# Patient Record
Sex: Female | Born: 1940 | Race: White | Hispanic: No | Marital: Married | Smoking: Former smoker
Health system: Southern US, Community
[De-identification: ages and names within clinical notes are randomized; demographics above are authoritative.]

## PROBLEM LIST (undated history)

## (undated) DIAGNOSIS — I50810 Right heart failure, unspecified: Secondary | ICD-10-CM

## (undated) DIAGNOSIS — Z720 Tobacco use: Secondary | ICD-10-CM

## (undated) DIAGNOSIS — J449 Chronic obstructive pulmonary disease, unspecified: Secondary | ICD-10-CM

## (undated) DIAGNOSIS — M810 Age-related osteoporosis without current pathological fracture: Secondary | ICD-10-CM

## (undated) DIAGNOSIS — I2729 Other secondary pulmonary hypertension: Secondary | ICD-10-CM

## (undated) DIAGNOSIS — E785 Hyperlipidemia, unspecified: Secondary | ICD-10-CM

## (undated) DIAGNOSIS — I10 Essential (primary) hypertension: Secondary | ICD-10-CM

## (undated) DIAGNOSIS — I272 Pulmonary hypertension, unspecified: Secondary | ICD-10-CM

## (undated) HISTORY — DX: Essential (primary) hypertension: I10

## (undated) HISTORY — PX: JOINT REPLACEMENT: SHX530

## (undated) HISTORY — DX: Hyperlipidemia, unspecified: E78.5

---
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
## 2005-02-18 ENCOUNTER — Other Ambulatory Visit: Admission: RE | Admit: 2005-02-18 | Discharge: 2005-02-18 | Payer: Self-pay | Admitting: Obstetrics and Gynecology

## 2006-06-14 ENCOUNTER — Encounter: Admission: RE | Admit: 2006-06-14 | Discharge: 2006-06-14 | Payer: Self-pay | Admitting: Family Medicine

## 2011-02-25 ENCOUNTER — Ambulatory Visit: Payer: Self-pay | Admitting: Internal Medicine

## 2012-05-31 HISTORY — PX: COLONOSCOPY: SHX174

## 2012-12-26 ENCOUNTER — Encounter: Payer: Self-pay | Admitting: Internal Medicine

## 2013-02-15 ENCOUNTER — Ambulatory Visit (AMBULATORY_SURGERY_CENTER): Payer: Self-pay | Admitting: *Deleted

## 2013-02-15 VITALS — Ht 62.0 in | Wt 157.4 lb

## 2013-02-15 DIAGNOSIS — Z86010 Personal history of colonic polyps: Secondary | ICD-10-CM

## 2013-02-15 MED ORDER — MOVIPREP 100 G PO SOLR: ORAL | Status: DC

## 2013-02-15 NOTE — Progress Notes (Signed)
No allergies to eggs or soy. No problems with anesthesia.  

## 2013-02-16 ENCOUNTER — Encounter: Payer: Self-pay | Admitting: Internal Medicine

## 2013-03-02 ENCOUNTER — Ambulatory Visit (AMBULATORY_SURGERY_CENTER): Payer: Medicare Other | Admitting: Internal Medicine

## 2013-03-02 ENCOUNTER — Encounter: Payer: Self-pay | Admitting: Internal Medicine

## 2013-03-02 VITALS — BP 107/51 | HR 76 | Temp 96.5°F | Resp 22 | Ht 62.0 in | Wt 157.0 lb

## 2013-03-02 DIAGNOSIS — D126 Benign neoplasm of colon, unspecified: Secondary | ICD-10-CM

## 2013-03-02 DIAGNOSIS — Z86010 Personal history of colonic polyps: Secondary | ICD-10-CM

## 2013-03-02 DIAGNOSIS — Z800 Family history of malignant neoplasm of digestive organs: Secondary | ICD-10-CM

## 2013-03-02 MED ORDER — SODIUM CHLORIDE 0.9 % IV SOLN: 500.0000 mL | INTRAVENOUS | Status: DC

## 2013-03-02 NOTE — Progress Notes (Signed)
Called to room to assist during endoscopic procedure.  Patient ID and intended procedure confirmed with present staff. Received instructions for my participation in the procedure from the performing physician.  

## 2013-03-02 NOTE — Op Note (Signed)
[facility] [address] [place], [address]   COLONOSCOPY PROCEDURE REPORT  PATIENT: [name], [name]  MR#: [id number] BIRTHDATE: 03/08/1941 , 72  yrs. old GENDER: Female ENDOSCOPIST: Hart Carwin, MD REFERRED HQ:IONGE Hamrick, M.D. PROCEDURE DATE:  03/02/2013 PROCEDURE:   Colonoscopy with cold biopsy polypectomy and Colonoscopy with snare polypectomy First Screening Colonoscopy - Avg.  risk and is 50 yrs.  old or older - No.  Prior Negative Screening - Now for repeat screening. N/A  History of Adenoma - Now for follow-up colonoscopy & has been > or = to 3 yrs.  N/A  Polyps Removed Today? Yes. ASA CLASS:   Class II INDICATIONS:Patient's immediate family history of colon cancer and 2003 colonoscopy- hyperplastic polyp, father with colon cancer. MEDICATIONS: MAC sedation, administered by CRNA and propofol (Diprivan) 500mg  IV  DESCRIPTION OF PROCEDURE:   After the risks benefits and alternatives of the procedure were thoroughly explained, informed consent was obtained.  A digital rectal exam revealed decreased sphincter tone.   The LB PFC-H190 [id number]  endoscope was introduced through the anus and advanced to the cecum, which was identified by both the appendix and ileocecal valve. No adverse events experienced.   The quality of the prep was good, using MoviPrep The instrument was then slowly withdrawn as the colon was fully examined.      COLON FINDINGS: An innumerable number of polypoid shaped and smooth sessile polyps ranging between 5-83mm in size were found throughout the entire examined colon.  A polypectomy was performed with cold forceps, with a cold snare and using snare cautery.  The resection was complete and the polyp tissue was completely retrieved.   There was moderate diverticulosis noted in the sigmoid colon with associated tortuosity, muscular hypertrophy and angulation. Retroflexed views revealed no abnormalities. The time to  cecum=11 minutes 46 seconds.  Withdrawal time=34 minutes 16 seconds.  The scope was withdrawn and the procedure completed. COMPLICATIONS: There were no complications.  ENDOSCOPIC IMPRESSION: 1.   Innumerable number of sessile polyps ranging between 5-52mm in size were found throughout the entire examined colon, predominanetly in the sigmoid colon; polypectomy was performed with cold forceps, with a cold snare and using snare cautery , more than 20 polyps removed 2.   There was moderate diverticulosis noted in the sigmoid colon  RECOMMENDATIONS: 1.  Await pathology results 2.  High fiber diet 3.   repeat colonoscopy in 6 months no ASA or NSAID's x 2 weeks   eSigned:  Hart Carwin, MD 03/02/2013 12:33 PM   cc:   PATIENT NAME:  [name], [name] MR#: [id number]

## 2013-03-02 NOTE — Patient Instructions (Addendum)
Discharge instructions given with verbal understanding. Handouts on polyps and diverticulosis. Resume previous medications. Hold aspirin and aspirin products for 2 weeks. YOU HAD AN ENDOSCOPIC PROCEDURE TODAY AT THE [facility]: Refer to the procedure report that was given to you for any specific questions about what was found during the examination.  If the procedure report does not answer your questions, please call your gastroenterologist to clarify.  If you requested that your care partner not be given the details of your procedure findings, then the procedure report has been included in a sealed envelope for you to review at your convenience later.  YOU SHOULD EXPECT: Some feelings of bloating in the abdomen. Passage of more gas than usual.  Walking can help get rid of the air that was put into your GI tract during the procedure and reduce the bloating. If you had a lower endoscopy (such as a colonoscopy or flexible sigmoidoscopy) you may notice spotting of blood in your stool or on the toilet paper. If you underwent a bowel prep for your procedure, then you may not have a normal bowel movement for a few days.  DIET: Your first meal following the procedure should be a light meal and then it is ok to progress to your normal diet.  A half-sandwich or bowl of soup is an example of a good first meal.  Heavy or fried foods are harder to digest and may make you feel nauseous or bloated.  Likewise meals heavy in dairy and vegetables can cause extra gas to form and this can also increase the bloating.  Drink plenty of fluids but you should avoid alcoholic beverages for 24 hours.  ACTIVITY: Your care partner should take you home directly after the procedure.  You should plan to take it easy, moving slowly for the rest of the day.  You can resume normal activity the day after the procedure however you should NOT DRIVE or use heavy machinery for 24 hours (because of the sedation medicines used  during the test).    SYMPTOMS TO REPORT IMMEDIATELY: A gastroenterologist can be reached at any hour.  During normal business hours, 8:30 AM to 5:00 PM Monday through Friday, call [phone].  After hours and on weekends, please call the GI answering service at [phone] who will take a message and have the physician on call contact you.   Following lower endoscopy (colonoscopy or flexible sigmoidoscopy):  Excessive amounts of blood in the stool  Significant tenderness or worsening of abdominal pains  Swelling of the abdomen that is new, acute  Fever of 100F or higher  FOLLOW UP: If any biopsies were taken you will be contacted by phone or by letter within the next 1-3 weeks.  Call your gastroenterologist if you have not heard about the biopsies in 3 weeks.  Our staff will call the home number listed on your records the next business day following your procedure to check on you and address any questions or concerns that you may have at that time regarding the information given to you following your procedure. This is a courtesy call and so if there is no answer at the home number and we have not heard from you through the emergency physician on call, we will assume that you have returned to your regular daily activities without incident.  SIGNATURES/CONFIDENTIALITY: You and/or your care partner have signed paperwork which will be entered into your electronic medical record.  These signatures attest to the fact that that  the information above on your After Visit Summary has been reviewed and is understood.  Full responsibility of the confidentiality of this discharge information lies with you and/or your care-partner.

## 2013-03-02 NOTE — Progress Notes (Signed)
Patient did not experience any of the following events: a burn prior to discharge; a fall within the facility; wrong site/side/patient/procedure/implant event; or a hospital transfer or hospital admission upon discharge from the facility. (G8907) Patient did not have preoperative order for IV antibiotic SSI prophylaxis. (G8918)  

## 2013-03-02 NOTE — Progress Notes (Signed)
Procedure ends, to recovery, report given and VSS. 

## 2013-03-05 ENCOUNTER — Telehealth: Payer: Self-pay

## 2013-03-05 NOTE — Telephone Encounter (Signed)
  Follow up Call-  Call back number 03/02/2013  Post procedure Call Back phone  # [phone]  Permission to leave phone message Yes     Patient questions:  Do you have a fever, pain , or abdominal swelling? no Pain Score  0 *  Have you tolerated food without any problems? yes  Have you been able to return to your normal activities? yes  Do you have any questions about your discharge instructions: Diet   no Medications  no Follow up visit  no  Do you have questions or concerns about your Care? no  Actions: * If pain score is 4 or above: No action needed, pain <4.

## 2013-03-08 ENCOUNTER — Encounter: Payer: Self-pay | Admitting: Internal Medicine

## 2013-04-17 ENCOUNTER — Other Ambulatory Visit: Payer: Self-pay | Admitting: Ophthalmology

## 2013-04-17 DIAGNOSIS — H469 Unspecified optic neuritis: Secondary | ICD-10-CM

## 2013-05-01 ENCOUNTER — Other Ambulatory Visit: Payer: Medicare Other

## 2013-05-10 ENCOUNTER — Other Ambulatory Visit: Payer: Medicare Other

## 2015-01-04 ENCOUNTER — Emergency Department (HOSPITAL_COMMUNITY): Payer: Medicare HMO

## 2015-01-04 ENCOUNTER — Inpatient Hospital Stay (HOSPITAL_COMMUNITY): Payer: Medicare HMO

## 2015-01-04 ENCOUNTER — Inpatient Hospital Stay (HOSPITAL_COMMUNITY)
Admission: EM | Admit: 2015-01-04 | Discharge: 2015-01-07 | DRG: 481 | Disposition: A | Discharge status: Skilled Nursing Facility | Payer: Medicare HMO | Attending: Internal Medicine | Admitting: Internal Medicine

## 2015-01-04 ENCOUNTER — Encounter (HOSPITAL_COMMUNITY): Payer: Self-pay | Admitting: Emergency Medicine

## 2015-01-04 ENCOUNTER — Inpatient Hospital Stay (HOSPITAL_COMMUNITY): Payer: Medicare HMO | Admitting: Anesthesiology

## 2015-01-04 ENCOUNTER — Encounter (HOSPITAL_COMMUNITY)
Admission: EM | Disposition: A | Discharge status: Skilled Nursing Facility | Payer: Self-pay | Source: Home / Self Care | Attending: Internal Medicine

## 2015-01-04 DIAGNOSIS — F17210 Nicotine dependence, cigarettes, uncomplicated: Secondary | ICD-10-CM | POA: Diagnosis present

## 2015-01-04 DIAGNOSIS — M542 Cervicalgia: Secondary | ICD-10-CM

## 2015-01-04 DIAGNOSIS — Z7982 Long term (current) use of aspirin: Secondary | ICD-10-CM

## 2015-01-04 DIAGNOSIS — W1809XA Striking against other object with subsequent fall, initial encounter: Secondary | ICD-10-CM | POA: Diagnosis present

## 2015-01-04 DIAGNOSIS — D72829 Elevated white blood cell count, unspecified: Secondary | ICD-10-CM | POA: Diagnosis present

## 2015-01-04 DIAGNOSIS — Z79899 Other long term (current) drug therapy: Secondary | ICD-10-CM

## 2015-01-04 DIAGNOSIS — Y92009 Unspecified place in unspecified non-institutional (private) residence as the place of occurrence of the external cause: Secondary | ICD-10-CM

## 2015-01-04 DIAGNOSIS — M25552 Pain in left hip: Secondary | ICD-10-CM | POA: Diagnosis present

## 2015-01-04 DIAGNOSIS — E785 Hyperlipidemia, unspecified: Secondary | ICD-10-CM | POA: Diagnosis present

## 2015-01-04 DIAGNOSIS — R0902 Hypoxemia: Secondary | ICD-10-CM | POA: Diagnosis present

## 2015-01-04 DIAGNOSIS — S72002A Fracture of unspecified part of neck of left femur, initial encounter for closed fracture: Secondary | ICD-10-CM

## 2015-01-04 DIAGNOSIS — E871 Hypo-osmolality and hyponatremia: Secondary | ICD-10-CM | POA: Diagnosis present

## 2015-01-04 DIAGNOSIS — S72142A Displaced intertrochanteric fracture of left femur, initial encounter for closed fracture: Principal | ICD-10-CM | POA: Diagnosis present

## 2015-01-04 DIAGNOSIS — I10 Essential (primary) hypertension: Secondary | ICD-10-CM | POA: Diagnosis present

## 2015-01-04 DIAGNOSIS — Z419 Encounter for procedure for purposes other than remedying health state, unspecified: Secondary | ICD-10-CM

## 2015-01-04 DIAGNOSIS — S72009A Fracture of unspecified part of neck of unspecified femur, initial encounter for closed fracture: Secondary | ICD-10-CM | POA: Diagnosis present

## 2015-01-04 DIAGNOSIS — S72002D Fracture of unspecified part of neck of left femur, subsequent encounter for closed fracture with routine healing: Secondary | ICD-10-CM | POA: Diagnosis not present

## 2015-01-04 DIAGNOSIS — R0600 Dyspnea, unspecified: Secondary | ICD-10-CM

## 2015-01-04 HISTORY — PX: INTRAMEDULLARY (IM) NAIL INTERTROCHANTERIC: SHX5875

## 2015-01-04 LAB — COMPREHENSIVE METABOLIC PANEL
ALBUMIN: 4 g/dL (ref 3.5–5.0)
ALT: 20 U/L (ref 14–54)
ANION GAP: 9 (ref 5–15)
AST: 21 U/L (ref 15–41)
Alkaline Phosphatase: 71 U/L (ref 38–126)
BUN: 13 mg/dL (ref 6–20)
CO2: 26 mmol/L (ref 22–32)
Calcium: 9.1 mg/dL (ref 8.9–10.3)
Chloride: 95 mmol/L — ABNORMAL LOW (ref 101–111)
Creatinine, Ser: 0.71 mg/dL (ref 0.44–1.00)
GFR calc non Af Amer: >60 mL/min (ref >60)
Glucose, Bld: 118 mg/dL — ABNORMAL HIGH (ref 65–99)
Potassium: 4.2 mmol/L (ref 3.5–5.1)
Sodium: 130 mmol/L — ABNORMAL LOW (ref 135–145)
Total Bilirubin: 0.8 mg/dL (ref 0.3–1.2)
Total Protein: 7.2 g/dL (ref 6.5–8.1)

## 2015-01-04 LAB — CBC WITH DIFFERENTIAL/PLATELET
Basophils Absolute: 0 10*3/uL (ref 0.0–0.1)
Basophils Relative: 0 % (ref 0–1)
EOS ABS: 0 10*3/uL (ref 0.0–0.7)
Eosinophils Relative: 0 % (ref 0–5)
HEMATOCRIT: 36.5 % (ref 36.0–46.0)
Hemoglobin: 12.7 g/dL (ref 12.0–15.0)
LYMPHS PCT: 11 % — AB (ref 12–46)
Lymphs Abs: 1.6 10*3/uL (ref 0.7–4.0)
MCH: 32.4 pg (ref 26.0–34.0)
MCHC: 34.8 g/dL (ref 30.0–36.0)
MCV: 93.1 fL (ref 78.0–100.0)
Monocytes Absolute: 1.3 10*3/uL — ABNORMAL HIGH (ref 0.1–1.0)
Monocytes Relative: 9 % (ref 3–12)
NEUTROS ABS: 11.9 10*3/uL — AB (ref 1.7–7.7)
NEUTROS PCT: 80 % — AB (ref 43–77)
PLATELETS: 264 10*3/uL (ref 150–400)
RBC: 3.92 MIL/uL (ref 3.87–5.11)
RDW: 13.1 % (ref 11.5–15.5)
WBC: 14.9 10*3/uL — ABNORMAL HIGH (ref 4.0–10.5)

## 2015-01-04 LAB — URINALYSIS, ROUTINE W REFLEX MICROSCOPIC
Bilirubin Urine: NEGATIVE
GLUCOSE, UA: NEGATIVE mg/dL
Hgb urine dipstick: NEGATIVE
Ketones, ur: NEGATIVE mg/dL
Leukocytes, UA: NEGATIVE
Nitrite: NEGATIVE
Protein, ur: NEGATIVE mg/dL
Specific Gravity, Urine: 1.011 (ref 1.005–1.030)
Urobilinogen, UA: 0.2 mg/dL (ref 0.0–1.0)
pH: 6 (ref 5.0–8.0)

## 2015-01-04 LAB — I-STAT CHEM 8, ED
BUN: 13 mg/dL (ref 6–20)
CHLORIDE: 94 mmol/L — AB (ref 101–111)
Calcium, Ion: 1.12 mmol/L — ABNORMAL LOW (ref 1.13–1.30)
Creatinine, Ser: 0.7 mg/dL (ref 0.44–1.00)
Glucose, Bld: 122 mg/dL — ABNORMAL HIGH (ref 65–99)
HEMATOCRIT: 40 % (ref 36.0–46.0)
Hemoglobin: 13.6 g/dL (ref 12.0–15.0)
Potassium: 4.2 mmol/L (ref 3.5–5.1)
SODIUM: 129 mmol/L — AB (ref 135–145)
TCO2: 27 mmol/L (ref 0–100)

## 2015-01-04 SURGERY — FIXATION, FRACTURE, INTERTROCHANTERIC, WITH INTRAMEDULLARY ROD
Anesthesia: General | Site: Hip | Laterality: Left

## 2015-01-04 MED ORDER — FENTANYL CITRATE (PF) 100 MCG/2ML IJ SOLN
INTRAMUSCULAR | Status: AC
  Filled 2015-01-04: qty 2

## 2015-01-04 MED ORDER — 0.9 % SODIUM CHLORIDE (POUR BTL) OPTIME
TOPICAL | Status: DC | PRN
  Administered 2015-01-04: 1000 mL

## 2015-01-04 MED ORDER — MORPHINE SULFATE 2 MG/ML IJ SOLN
2.0000 mg | Freq: Once | INTRAMUSCULAR | Status: AC
  Administered 2015-01-04: 2 mg via INTRAVENOUS
  Filled 2015-01-04: qty 1

## 2015-01-04 MED ORDER — ENOXAPARIN SODIUM 40 MG/0.4ML ~~LOC~~ SOLN: 40.0000 mg | SUBCUTANEOUS | Status: DC

## 2015-01-04 MED ORDER — ONDANSETRON HCL 4 MG/2ML IJ SOLN
INTRAMUSCULAR | Status: AC
  Filled 2015-01-04: qty 2

## 2015-01-04 MED ORDER — HYDROCODONE-ACETAMINOPHEN 5-325 MG PO TABS: 1.0000 | ORAL_TABLET | Freq: Four times a day (QID) | ORAL | Status: DC | PRN

## 2015-01-04 MED ORDER — PROPOFOL 10 MG/ML IV BOLUS
INTRAVENOUS | Status: DC | PRN
  Administered 2015-01-04: 140 mg via INTRAVENOUS

## 2015-01-04 MED ORDER — ONDANSETRON HCL 4 MG/2ML IJ SOLN
INTRAMUSCULAR | Status: DC | PRN
  Administered 2015-01-04: 4 mg via INTRAVENOUS

## 2015-01-04 MED ORDER — EPHEDRINE SULFATE 50 MG/ML IJ SOLN
INTRAMUSCULAR | Status: AC
  Filled 2015-01-04: qty 1

## 2015-01-04 MED ORDER — NEOSTIGMINE METHYLSULFATE 10 MG/10ML IV SOLN
INTRAVENOUS | Status: DC | PRN
  Administered 2015-01-04: 3 mg via INTRAVENOUS

## 2015-01-04 MED ORDER — SUCCINYLCHOLINE CHLORIDE 20 MG/ML IJ SOLN
INTRAMUSCULAR | Status: DC | PRN
  Administered 2015-01-04: 100 mg via INTRAVENOUS

## 2015-01-04 MED ORDER — EPHEDRINE SULFATE 50 MG/ML IJ SOLN
INTRAMUSCULAR | Status: DC | PRN
  Administered 2015-01-04: 10 mg via INTRAVENOUS
  Administered 2015-01-04: 15 mg via INTRAVENOUS

## 2015-01-04 MED ORDER — CEFAZOLIN SODIUM-DEXTROSE 2-3 GM-% IV SOLR
2.0000 g | INTRAVENOUS | Status: AC
  Administered 2015-01-04: 2 g via INTRAVENOUS

## 2015-01-04 MED ORDER — GLYCOPYRROLATE 0.2 MG/ML IJ SOLN
INTRAMUSCULAR | Status: AC
  Filled 2015-01-04: qty 1

## 2015-01-04 MED ORDER — DEXAMETHASONE SODIUM PHOSPHATE 10 MG/ML IJ SOLN
INTRAMUSCULAR | Status: AC
  Filled 2015-01-04: qty 1

## 2015-01-04 MED ORDER — METHOCARBAMOL 500 MG PO TABS
500.0000 mg | ORAL_TABLET | Freq: Four times a day (QID) | ORAL | Status: DC | PRN
  Administered 2015-01-05 – 2015-01-07 (×3): 500 mg via ORAL
  Filled 2015-01-04 (×3): qty 1

## 2015-01-04 MED ORDER — DEXTROSE 5 % IV SOLN
500.0000 mg | Freq: Four times a day (QID) | INTRAVENOUS | Status: DC | PRN
  Administered 2015-01-05: 500 mg via INTRAVENOUS
  Filled 2015-01-04 (×2): qty 5

## 2015-01-04 MED ORDER — DEXAMETHASONE SODIUM PHOSPHATE 10 MG/ML IJ SOLN
INTRAMUSCULAR | Status: DC | PRN
  Administered 2015-01-04: 10 mg via INTRAVENOUS

## 2015-01-04 MED ORDER — NEOSTIGMINE METHYLSULFATE 10 MG/10ML IV SOLN
INTRAVENOUS | Status: AC
  Filled 2015-01-04: qty 1

## 2015-01-04 MED ORDER — PROPOFOL 10 MG/ML IV BOLUS
INTRAVENOUS | Status: AC
  Filled 2015-01-04: qty 20

## 2015-01-04 MED ORDER — CEFAZOLIN SODIUM-DEXTROSE 2-3 GM-% IV SOLR
INTRAVENOUS | Status: AC
  Filled 2015-01-04: qty 50

## 2015-01-04 MED ORDER — SODIUM CHLORIDE 0.9 % IV SOLN
INTRAVENOUS | Status: DC | PRN
  Administered 2015-01-04 (×2): via INTRAVENOUS

## 2015-01-04 MED ORDER — FENTANYL CITRATE (PF) 250 MCG/5ML IJ SOLN
INTRAMUSCULAR | Status: DC | PRN
  Administered 2015-01-04: 25 ug via INTRAVENOUS

## 2015-01-04 MED ORDER — FENTANYL CITRATE (PF) 100 MCG/2ML IJ SOLN
INTRAMUSCULAR | Status: AC
  Filled 2015-01-04: qty 4

## 2015-01-04 MED ORDER — ROCURONIUM BROMIDE 100 MG/10ML IV SOLN
INTRAVENOUS | Status: DC | PRN
  Administered 2015-01-04: 10 mg via INTRAVENOUS

## 2015-01-04 MED ORDER — FENTANYL CITRATE (PF) 100 MCG/2ML IJ SOLN
25.0000 ug | INTRAMUSCULAR | Status: DC | PRN
  Administered 2015-01-04: 25 ug via INTRAVENOUS

## 2015-01-04 MED ORDER — ONDANSETRON HCL 4 MG/2ML IJ SOLN
4.0000 mg | Freq: Once | INTRAMUSCULAR | Status: AC
  Administered 2015-01-04: 4 mg via INTRAVENOUS
  Filled 2015-01-04: qty 2

## 2015-01-04 MED ORDER — SODIUM CHLORIDE 0.9 % IV BOLUS (SEPSIS)
1000.0000 mL | Freq: Once | INTRAVENOUS | Status: AC
  Administered 2015-01-04: 1000 mL via INTRAVENOUS

## 2015-01-04 MED ORDER — GLYCOPYRROLATE 0.2 MG/ML IJ SOLN
INTRAMUSCULAR | Status: AC
  Filled 2015-01-04: qty 2

## 2015-01-04 MED ORDER — GLYCOPYRROLATE 0.2 MG/ML IJ SOLN
INTRAMUSCULAR | Status: DC | PRN
  Administered 2015-01-04: .5 mg via INTRAVENOUS

## 2015-01-04 SURGICAL SUPPLY — 42 items
BAG SPEC THK2 15X12 ZIP CLS (MISCELLANEOUS) ×1
BAG ZIPLOCK 12X15 (MISCELLANEOUS) ×2 IMPLANT
BIT DRILL CANN LG 4.3MM (BIT) IMPLANT
BNDG COHESIVE 6X5 TAN STRL LF (GAUZE/BANDAGES/DRESSINGS) ×1 IMPLANT
COVER PERINEAL POST (MISCELLANEOUS) ×1 IMPLANT
DRAPE C-ARM 42X120 X-RAY (DRAPES) ×1 IMPLANT
DRAPE INCISE IOBAN 66X45 STRL (DRAPES) ×2 IMPLANT
DRAPE ORTHO SPLIT 77X108 STRL (DRAPES)
DRAPE SHEET LG 3/4 BI-LAMINATE (DRAPES) ×1 IMPLANT
DRAPE STERI IOBAN 125X83 (DRAPES) ×2 IMPLANT
DRAPE SURG ORHT 6 SPLT 77X108 (DRAPES) ×1 IMPLANT
DRAPE U-SHAPE 47X51 STRL (DRAPES) ×1 IMPLANT
DRILL BIT CANN LG 4.3MM (BIT) ×2
DRSG MEPILEX BORDER 4X4 (GAUZE/BANDAGES/DRESSINGS) ×1 IMPLANT
DRSG PAD ABDOMINAL 8X10 ST (GAUZE/BANDAGES/DRESSINGS) IMPLANT
DURAPREP 26ML APPLICATOR (WOUND CARE) ×2 IMPLANT
ELECT REM PT RETURN 9FT ADLT (ELECTROSURGICAL) ×2
ELECTRODE REM PT RTRN 9FT ADLT (ELECTROSURGICAL) ×1 IMPLANT
FACESHIELD WRAPAROUND (MASK) ×2 IMPLANT
FACESHIELD WRAPAROUND OR TEAM (MASK) ×2 IMPLANT
GAUZE SPONGE 4X4 12PLY STRL (GAUZE/BANDAGES/DRESSINGS) ×2 IMPLANT
GLOVE ORTHO TXT STRL SZ7.5 (GLOVE) ×2 IMPLANT
GLOVE SURG ORTHO 8.5 STRL (GLOVE) ×2 IMPLANT
GOWN STRL REUS W/TWL LRG LVL3 (GOWN DISPOSABLE) ×4 IMPLANT
GUIDEPIN 3.2X17.5 THRD DISP (PIN) ×1 IMPLANT
KIT BASIN OR (CUSTOM PROCEDURE TRAY) ×2 IMPLANT
MANIFOLD NEPTUNE II (INSTRUMENTS) ×2 IMPLANT
NAIL HIP FRACT 130D 11X180 (Screw) ×1 IMPLANT
NS IRRIG 1000ML POUR BTL (IV SOLUTION) ×2 IMPLANT
PACK GENERAL/GYN (CUSTOM PROCEDURE TRAY) ×2 IMPLANT
PACK ORTHO EXTREMITY (CUSTOM PROCEDURE TRAY) ×1 IMPLANT
PAD CAST 4YDX4 CTTN HI CHSV (CAST SUPPLIES) ×1 IMPLANT
PADDING CAST COTTON 4X4 STRL (CAST SUPPLIES)
POSITIONER SURGICAL ARM (MISCELLANEOUS) ×3 IMPLANT
SCREW BONE CORTICAL 5.0X3 (Screw) ×1 IMPLANT
SCREW LAG HIP NAIL 10.5X95 (Screw) ×2 IMPLANT
STAPLER VISISTAT (STAPLE) ×2 IMPLANT
STOCKINETTE 8 INCH (MISCELLANEOUS) ×1 IMPLANT
SUT VIC AB 0 CT1 36 (SUTURE) ×4 IMPLANT
SUT VIC AB 2-0 CT1 27 (SUTURE) ×2
SUT VIC AB 2-0 CT1 TAPERPNT 27 (SUTURE) ×1 IMPLANT
TOWEL OR 17X26 10 PK STRL BLUE (TOWEL DISPOSABLE) ×4 IMPLANT

## 2015-01-04 NOTE — Anesthesia Procedure Notes (Signed)
Procedure Name: Intubation Date/Time: 01/04/2015 5:04 PM Performed by: Dione Booze Pre-anesthesia Checklist: Patient identified, Emergency Drugs available, Suction available and Patient being monitored Patient Re-evaluated:Patient Re-evaluated prior to inductionOxygen Delivery Method: Circle system utilized Preoxygenation: Pre-oxygenation with 100% oxygen Intubation Type: IV induction Laryngoscope Size: Mac and 4 Grade View: Grade II Tube type: Oral Tube size: 7.5 mm Number of attempts: 1 Airway Equipment and Method: Stylet Placement Confirmation: ETT inserted through vocal cords under direct vision,  breath sounds checked- equal and bilateral and positive ETCO2 Secured at: 21 cm Tube secured with: Tape Dental Injury: Teeth and Oropharynx as per pre-operative assessment  Comments: On [place] 3L on adm to OR. O2 sat 84 on RA

## 2015-01-04 NOTE — Brief Op Note (Signed)
01/04/2015  6:06 PM  PATIENT:  [name]  74 y.o. female  PRE-OPERATIVE DIAGNOSIS:  left intertroch fracture  POST-OPERATIVE DIAGNOSIS:  left intertroch fracture  PROCEDURE:  Procedure(s): INTRAMEDULLARY (IM) NAIL INTERTROCHANTRIC (Left) Biomet Affixis Nail 130 degree 33mm  SURGEON:  Surgeon(s) and Role:    * Netta Cedars, MD - Primary  PHYSICIAN ASSISTANT:   ASSISTANTS: Ventura Bruns, PA-C   ANESTHESIA:   general  EBL:  Total I/O In: 1000 [I.V.:1000] Out: 425 [Urine:425]  BLOOD ADMINISTERED:none  DRAINS: none   LOCAL MEDICATIONS USED:  NONE  SPECIMEN:  No Specimen  DISPOSITION OF SPECIMEN:  N/A  COUNTS:  YES  TOURNIQUET:  * No tourniquets in log *  DICTATION: .Other Dictation: Dictation Number [id number]  PLAN OF CARE: Admit to inpatient   PATIENT DISPOSITION:  PACU - hemodynamically stable.   Delay start of Pharmacological VTE agent (>24hrs) due to surgical blood loss or risk of bleeding: no

## 2015-01-04 NOTE — Discharge Instructions (Signed)
Ice to the left hip,  Partial (25%) body weight on the left LE  Patient will need 30 days of subQ Lovenox for DVT prophylaxis  Keep the incision covered and clean and dry for one week, then ok to shower and get the wound wet  Follow up with Dr Veverly Fells in two weeks  [phone]

## 2015-01-04 NOTE — ED Notes (Signed)
Patient transported to CT 

## 2015-01-04 NOTE — Consult Note (Signed)
Reason for Consult:Broken left hip Referring Physician: Elgergawy  [name] is an 74 y.o. female.  HPI: 74 yo female s/p mechanical fall today resulting in injury to the left hip. Patient complaining of severe left hip pain and inability to walk after the fall. Patient presented to the [facility] ED for further eval and treatment. Denies other pain or LOC.  Past Medical History  Diagnosis Date  . Hypertension   . Hyperlipidemia     Past Surgical History  Procedure Laterality Date  . Appendectomy  1954    Family History  Problem Relation Age of Onset  . Colon cancer Father 60    Social History:  reports that she has been smoking Cigarettes.  She has been smoking about 0.50 packs per day. She has never used smokeless tobacco. She reports that she drinks about 7.0 oz of alcohol per week. She reports that she does not use illicit drugs.  Allergies:  Allergies  Allergen Reactions  . Codeine Hives    Medications: I have reviewed the patient's current medications.  Results for orders placed or performed during the hospital encounter of 01/04/15 (from the past 48 hour(s))  CBC with Differential     Status: Abnormal   Collection Time: 01/04/15  2:30 PM  Result Value Ref Range   WBC 14.9 (H) 4.0 - 10.5 K/uL   RBC 3.92 3.87 - 5.11 MIL/uL   Hemoglobin 12.7 12.0 - 15.0 g/dL   HCT 36.5 36.0 - 46.0 %   MCV 93.1 78.0 - 100.0 fL   MCH 32.4 26.0 - 34.0 pg   MCHC 34.8 30.0 - 36.0 g/dL   RDW 13.1 11.5 - 15.5 %   Platelets 264 150 - 400 K/uL   Neutrophils Relative % 80 (H) 43 - 77 %   Neutro Abs 11.9 (H) 1.7 - 7.7 K/uL   Lymphocytes Relative 11 (L) 12 - 46 %   Lymphs Abs 1.6 0.7 - 4.0 K/uL   Monocytes Relative 9 3 - 12 %   Monocytes Absolute 1.3 (H) 0.1 - 1.0 K/uL   Eosinophils Relative 0 0 - 5 %   Eosinophils Absolute 0.0 0.0 - 0.7 K/uL   Basophils Relative 0 0 - 1 %   Basophils Absolute 0.0 0.0 - 0.1 K/uL  I-Stat Chem 8, ED     Status: Abnormal   Collection Time: 01/04/15  2:41 PM   Result Value Ref Range   Sodium 129 (L) 135 - 145 mmol/L   Potassium 4.2 3.5 - 5.1 mmol/L   Chloride 94 (L) 101 - 111 mmol/L   BUN 13 6 - 20 mg/dL   Creatinine, Ser 0.70 0.44 - 1.00 mg/dL   Glucose, Bld 122 (H) 65 - 99 mg/dL   Calcium, Ion 1.12 (L) 1.13 - 1.30 mmol/L   TCO2 27 0 - 100 mmol/L   Hemoglobin 13.6 12.0 - 15.0 g/dL   HCT 40.0 36.0 - 46.0 %    Dg Chest 1 View  01/04/2015   CLINICAL DATA:  74 year old female with history of trauma from a fall yesterday evening.  EXAM: CHEST  1 VIEW  COMPARISON:  No priors.  FINDINGS: Lung volumes are normal. No consolidative airspace disease. No pleural effusions. No pneumothorax. No evidence of pulmonary edema. Heart size is borderline enlarged. The patient is rotated to the right on today's exam, resulting in distortion of the mediastinal contours and reduced diagnostic sensitivity and specificity for mediastinal pathology. Visualized bony thorax is grossly intact. Old healed fractures of  the lateral aspects of the right seventh and eighth ribs.  IMPRESSION: 1. No radiographic evidence of acute cardiopulmonary disease.   Electronically Signed   By: Vinnie Langton M.D.   On: 01/04/2015 14:25   Dg Knee 2 Views Left  01/04/2015   CLINICAL DATA:  Pt from home. Around 1800 last night, pt was sitting at the table. Left foot felt tingly and "asleep." Went to stand up and tripped over chair and fell. C/o left femur pain and lower back pain. Foreshortening of left leg noted, as well as an external rotation. *Best obtainable images due to limited movement of pt.  EXAM: LEFT KNEE - 1-2 VIEW  COMPARISON:  None.  FINDINGS: No fracture. There is mild medial joint space compartment narrowing. No other arthropathic change. Bone to demineralized. No bone lesion.  No joint effusion.  Vascular calcifications are noted posteriorly.  IMPRESSION: No fracture or acute finding.   Electronically Signed   By: Lajean Manes M.D.   On: 01/04/2015 14:27   Ct Head Wo  Contrast  01/04/2015   CLINICAL DATA:  74 year old female with history of neck pain.  EXAM: CT HEAD WITHOUT CONTRAST  CT CERVICAL SPINE WITHOUT CONTRAST  TECHNIQUE: Multidetector CT imaging of the head and cervical spine was performed following the standard protocol without intravenous contrast. Multiplanar CT image reconstructions of the cervical spine were also generated.  COMPARISON:  No priors.  FINDINGS: CT HEAD FINDINGS  Mild cerebral atrophy. Patchy and confluent areas of decreased attenuation are noted throughout the deep and periventricular white matter of the cerebral hemispheres bilaterally, compatible with chronic microvascular ischemic disease. 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 No acute intracranial abnormalities. Specifically, no evidence of acute intracranial hemorrhage, no definite findings of acute/subacute cerebral ischemia, no mass, mass effect, hydrocephalus or abnormal intra or extra-axial fluid collections. Visualized paranasal sinuses and mastoids are well pneumatized. No acute displaced skull fractures are identified.  CT CERVICAL SPINE FINDINGS  No acute displaced fracture of the cervical spine. Straightening of normal cervical lordosis, favored to be positional. Alignment is otherwise anatomic. Multilevel degenerative disc disease, most severe at C5-C6 and C6-C7. Multilevel facet arthropathy. Prevertebral soft tissues are normal. Visualized portions of the upper thorax are unremarkable.  IMPRESSION: 1. No acute abnormality of the cervical spine to account for the patient's symptoms. 2. 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 3. No acute intracranial abnormalities. 4. Mild cerebral atrophy and chronic microvascular ischemic changes throughout the deep and periventricular white matter of the cerebral  hemispheres bilaterally. 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   Electronically Signed   By: Vinnie Langton M.D.   On: 01/04/2015 13:46   Ct Cervical Spine Wo Contrast  01/04/2015   CLINICAL DATA:  74 year old female with history of neck pain.  EXAM: CT HEAD WITHOUT CONTRAST  CT CERVICAL SPINE WITHOUT CONTRAST  TECHNIQUE: Multidetector CT imaging of the head and cervical spine was performed following the standard protocol without intravenous contrast. Multiplanar CT image reconstructions of the cervical spine were also generated.  COMPARISON:  No priors.  FINDINGS: CT HEAD FINDINGS  Mild cerebral atrophy. Patchy and confluent areas of decreased attenuation are noted throughout the deep and periventricular white matter of the cerebral hemispheres bilaterally, compatible with  chronic microvascular ischemic disease. 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 No acute intracranial abnormalities. Specifically, no evidence of acute intracranial hemorrhage, no definite findings of acute/subacute cerebral ischemia, no mass, mass effect, hydrocephalus or abnormal intra or extra-axial fluid collections. Visualized paranasal sinuses and mastoids are well pneumatized. No acute displaced skull fractures are identified.  CT CERVICAL SPINE FINDINGS  No acute displaced fracture of the cervical spine. Straightening of normal cervical lordosis, favored to be positional. Alignment is otherwise anatomic. Multilevel degenerative disc disease, most severe at C5-C6 and C6-C7. Multilevel facet arthropathy. Prevertebral soft tissues are normal. Visualized portions of the upper thorax are unremarkable.  IMPRESSION: 1. No acute abnormality of the cervical spine to account for the patient's symptoms. 2. There is  severe multilevel degenerative disc disease and cervical spondylosis, as above. 3. No acute intracranial abnormalities. 4. Mild cerebral atrophy and chronic microvascular ischemic changes throughout the deep and periventricular white matter of the cerebral hemispheres bilaterally. 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   Electronically Signed   By: Vinnie Langton M.D.   On: 01/04/2015 13:46   Dg Hip Unilat With Pelvis 2-3 Views Left  01/04/2015   CLINICAL DATA:  Pt from home. Around 1800 last night, pt was sitting at the table. Left foot felt tingly and "asleep." Went to stand up and tripped over chair and fell. C/o left femur pain and lower back pain. Foreshortening of left leg noted, as well as an external rotation. *Best obtainable images due to limited movement of pt.  EXAM: DG HIP (WITH OR WITHOUT PELVIS) 2-3V LEFT  COMPARISON:  None.  FINDINGS: There is an intertrochanteric fracture of the left proximal femur. Distal fracture component is displaced 19 mm lateral to the proximal fracture component. There is mild varus angulation. There is also apex anterior angulation. No significant comminution.  No other fractures. Bones are diffusely demineralized. Hip joints are normally aligned.  Soft tissues show dense iliac and femoral artery vascular calcifications.  IMPRESSION: Mildly displaced and angulated intertrochanteric fracture of the left proximal femur.   Electronically Signed   By: Lajean Manes M.D.   On: 01/04/2015 14:26   Dg Femur Min 2 Views Left  01/04/2015   CLINICAL DATA:  74 year old female with history of trauma from a fall yesterday complaining of left hip pain.  EXAM: LEFT FEMUR 2 VIEWS  COMPARISON:  No priors.  FINDINGS: Five views of the left femur demonstrate an acute mildly displace and angulated intertrochanteric fracture of the left hip, with approximately 20 degrees of varus angulation. Femoral  head appears properly located within the left acetabulum on the frontal projection.  IMPRESSION: 1. Acute minimally displaced mildly angulated intertrochanteric fracture of the left hip, as above.   Electronically Signed   By: Vinnie Langton M.D.   On: 01/04/2015 14:27    ROS Blood pressure 128/65, pulse 86, temperature 98.1 F (36.7 C), temperature source Oral, resp. rate 17, SpO2 90 %. Physical Exam AAO, moderate distress, neck nontender, normal ROM, bilateral shoulders elbow and wrists without pain and normal strength and ROM, right LE with pain free ROM, no deformity, left hip tender with limited ROM due to pain, left knee nontender, shortened extremity, externally rotated. NVI  Assessment/Plan: Displaced left intertrochanteric hip fracture,  Patient medically cleared  for surgery.. I discussed with the patient the need for surgery for this displaced hip fracture,.  She understands and agrees.  Plan IM nailing today. Likely short term SNF rehab.  Aiyana Stegmann,STEVEN R 01/04/2015, 3:16 PM

## 2015-01-04 NOTE — Op Note (Signed)
[name], [name] NO.:  [id number]  MEDICAL RECORD NO.:  [id number]  LOCATION:  64                         FACILITY:  [facility]  PHYSICIAN:  Doran Heater. Veverly Fells, M.D. DATE OF BIRTH:  [birth year]  DATE OF PROCEDURE:  01/04/2015 DATE OF DISCHARGE:                              OPERATIVE REPORT   PREOPERATIVE DIAGNOSIS:  Displaced left intertrochanteric hip fracture.  POSTOPERATIVE DIAGNOSIS:  Displaced left intertrochanteric hip fracture.  PROCEDURE PERFORMED:  Left hip intertrochanteric hip fracture, IM nailing using Biomet Affixus nail.  ATTENDING SURGEON:  Doran Heater. Veverly Fells, MD  ASSISTANT:  Charletta Cousin Dixon PA-C who has scrubbed the entire procedure and necessary for satisfactory completion of surgery.  ANESTHESIA:  General anesthesia was used.  ESTIMATED BLOOD LOSS:  Less than 100 mL.  FLUID REPLACEMENT:  1200 mL crystalloid.  INSTRUMENT COUNTS:  Correct.  COMPLICATIONS:  There were no complications.  ANTIBIOTICS:  Perioperative antibiotics were given.  INDICATIONS:  Patient is a 74 year old female, who presents with history of a mechanical fall injuring her left hip.  The patient presented with a displaced intertrochanteric hip fracture, requiring hip surgery, discussed this with the patient recommending surgical repair with IM nail.  Patient agreed.  Informed consent obtained.  DESCRIPTION OF PROCEDURE:  After an adequate level of anesthesia was achieved, the patient was positioned in the supine position.  She was placed on the fracture table with the perineal post.  Left foot was placed in traction boot and traction applied with internal rotation. The right leg was placed in modified lithotomy position.  C-arm was brought into the field.  Time-out was called.  We then assured that we had appropriate fracture reduction with the C-arm prior to our prep and drape.  Once we prepped and draped, we verified our time-out.  We then entered the femur  using a longitudinal incision proximal to the greater trochanter, dissection down through subcutaneous tissues using Bovie. We used the Bovie to divide the tensor fascia lata.  We then went ahead and found the greater trochanter and found our starting point with a guide pin with the fluoro.  We used multiplanar fluoroscopy to ensure the guide pin was entering the tip of the greater trochanter as verified on the AP and lateral views.  Once that the guide pin was in proper position for the Biomet trochanteric nail, we over reamed with the step- cut reamer.  We then introduced the 130 degree 11 mm Affixus nail antegrade across the fracture site, we got this to the appropriate depth.  We then placed a guide pin for the lag screw up into the femoral head verifying it's appropriate position on the AP and lateral views. We then over drilled with a step-cut drill for the lag screw and then measured to measurement of 95 mm length for the screw and then introduced a screw across the fracture site.  We gained good purchase in the inferior and posterior portions of the femoral neck and head and then went ahead and applied slight traction across that relieving some traction on the distal portion of the fracture table and then there was a fracture compression device on the nail  insertion handle which we were able to apply a little bit of pressure there and see the fracture site basically go away on the x-ray.  We then placed our statically locked bicortical distal interlock screw using the jig, this was 34 mm in length.  We inserted that in place.  We then obtained multiplanar views, AP and lateral, distally and proximally to make sure our screw was in good position.  Her fracture was anatomically reduced which it was.  We removed the insertion jig, thoroughly irrigated, and then repaired the deep tissues with 0 Vicryl suture followed by 2-0 Vicryl subcutaneous closure and staples for skin.  Sterile  compressive bandage applied. Patient transported to the recovery room in stable condition.     Doran Heater. Veverly Fells, M.D.     SRN/MEDQ  D:  01/04/2015  T:  01/04/2015  Job:  [id number]

## 2015-01-04 NOTE — ED Provider Notes (Signed)
CSN: [id number]     Arrival date & time 01/04/15  1250 History   First MD Initiated Contact with Patient 01/04/15 1252     Chief Complaint  Patient presents with  . Fall  . Leg Injury  . Hip Injury     (Consider location/radiation/quality/duration/timing/severity/associated sxs/prior Treatment) Patient is a 74 y.o. female presenting with fall. The history is provided by the patient.  Fall This is a new problem. The current episode started yesterday. The problem occurs rarely. The problem has been gradually worsening. Pertinent negatives include no chest pain, no abdominal pain, no headaches and no shortness of breath. The symptoms are aggravated by walking. Nothing relieves the symptoms. She has tried nothing for the symptoms. The treatment provided no relief.   74 yo F with a chief complaint of a fall. Patient states that her foot fell asleep and she stood up in the sensation of her foot asleep and dropped to the ground. Patient had some pain and swelling to her leg but ignored it until today. Patient with continuing worsening pain shortening and external rotation. Patient denies head injury denies loss of consciousness denies back pain chest pain abdominal pain. Past Medical History  Diagnosis Date  . Hypertension   . Hyperlipidemia    Past Surgical History  Procedure Laterality Date  . Appendectomy  1954   Family History  Problem Relation Age of Onset  . Colon cancer Father 61   History  Substance Use Topics  . Smoking status: Current Every Day Smoker -- 0.50 packs/day    Types: Cigarettes  . Smokeless tobacco: Never Used  . Alcohol Use: 7.0 oz/week    14 drink(s) per week   OB History    No data available     Review of Systems  Constitutional: Negative for fever and chills.  HENT: Negative for congestion and rhinorrhea.   Eyes: Negative for redness and visual disturbance.  Respiratory: Negative for shortness of breath and wheezing.   Cardiovascular: Negative for  chest pain and palpitations.  Gastrointestinal: Negative for nausea, vomiting and abdominal pain.  Genitourinary: Negative for dysuria and urgency.  Musculoskeletal: Positive for myalgias and arthralgias.  Skin: Negative for pallor and wound.  Neurological: Negative for dizziness and headaches.      Allergies  Codeine  Home Medications   Prior to Admission medications   Medication Sig Start Date End Date Taking? Authorizing Provider  acetaminophen (TYLENOL) 500 MG tablet Take 500 mg by mouth every 6 (six) hours as needed for mild pain or headache.   Yes Historical Provider, MD  amLODipine (NORVASC) 10 MG tablet Take 10 mg by mouth daily.   Yes Historical Provider, MD  aspirin 81 MG tablet Take 81 mg by mouth daily.   Yes Historical Provider, MD  benazepril (LOTENSIN) 20 MG tablet Take 20 mg by mouth daily.   Yes Historical Provider, MD  betamethasone valerate lotion (VALISONE) 0.1 % Apply 1 application topically 2 (two) times daily.   Yes Historical Provider, MD  metoprolol (LOPRESSOR) 50 MG tablet Take 50 mg by mouth daily.   Yes Historical Provider, MD   BP 128/65 mmHg  Pulse 86  Temp(Src) 98.1 F (36.7 C) (Oral)  Resp 17  SpO2 90% Physical Exam  Constitutional: She is oriented to person, place, and time. She appears well-developed and well-nourished. No distress.  HENT:  Head: Normocephalic and atraumatic.  Eyes: EOM are normal. Pupils are equal, round, and reactive to light.  Neck: Normal range of motion. Neck supple.  Cardiovascular: Normal rate and regular rhythm.  Exam reveals no gallop and no friction rub.   No murmur heard. Pulmonary/Chest: Effort normal. She has no wheezes. She has no rales.  Abdominal: Soft. She exhibits no distension. There is no tenderness.  Musculoskeletal: She exhibits edema. She exhibits no tenderness.  Marked edema to proximal left thigh. Pulse motor and sensation intact distally. Painful to the mid shaft of the femur as well as  left hip.   Neurological: She is alert and oriented to person, place, and time.  Skin: Skin is warm and dry. She is not diaphoretic.  Psychiatric: She has a normal mood and affect. Her behavior is normal.    ED Course  Procedures (including critical care time) Labs Review Labs Reviewed  CBC WITH DIFFERENTIAL/PLATELET - Abnormal; Notable for the following:    WBC 14.9 (*)    Neutrophils Relative % 80 (*)    Neutro Abs 11.9 (*)    Lymphocytes Relative 11 (*)    Monocytes Absolute 1.3 (*)    All other components within normal limits  COMPREHENSIVE METABOLIC PANEL - Abnormal; Notable for the following:    Sodium 130 (*)    Chloride 95 (*)    Glucose, Bld 118 (*)    All other components within normal limits  I-STAT CHEM 8, ED - Abnormal; Notable for the following:    Sodium 129 (*)    Chloride 94 (*)    Glucose, Bld 122 (*)    Calcium, Ion 1.12 (*)    All other components within normal limits  URINALYSIS, ROUTINE W REFLEX MICROSCOPIC (NOT AT [facility])    Imaging Review Dg Chest 1 View  01/04/2015   CLINICAL DATA:  74 year old female with history of trauma from a fall yesterday evening.  EXAM: CHEST  1 VIEW  COMPARISON:  No priors.  FINDINGS: Lung volumes are normal. No consolidative airspace disease. No pleural effusions. No pneumothorax. No evidence of pulmonary edema. Heart size is borderline enlarged. The patient is rotated to the right on today's exam, resulting in distortion of the mediastinal contours and reduced diagnostic sensitivity and specificity for mediastinal pathology. Visualized bony thorax is grossly intact. Old healed fractures of the lateral aspects of the right seventh and eighth ribs.  IMPRESSION: 1. No radiographic evidence of acute cardiopulmonary disease.   Electronically Signed   By: Vinnie Langton M.D.   On: 01/04/2015 14:25   Dg Knee 2 Views Left  01/04/2015   CLINICAL DATA:  Pt from home. Around 1800 last night, pt was sitting at the table. Left foot felt tingly and  "asleep." Went to stand up and tripped over chair and fell. C/o left femur pain and lower back pain. Foreshortening of left leg noted, as well as an external rotation. *Best obtainable images due to limited movement of pt.  EXAM: LEFT KNEE - 1-2 VIEW  COMPARISON:  None.  FINDINGS: No fracture. There is mild medial joint space compartment narrowing. No other arthropathic change. Bone to demineralized. No bone lesion.  No joint effusion.  Vascular calcifications are noted posteriorly.  IMPRESSION: No fracture or acute finding.   Electronically Signed   By: Lajean Manes M.D.   On: 01/04/2015 14:27   Ct Head Wo Contrast  01/04/2015   CLINICAL DATA:  74 year old female with history of neck pain.  EXAM: CT HEAD WITHOUT CONTRAST  CT CERVICAL SPINE WITHOUT CONTRAST  TECHNIQUE: Multidetector CT imaging of the head and cervical spine was performed following the standard protocol without  intravenous contrast. Multiplanar CT image reconstructions of the cervical spine were also generated.  COMPARISON:  No priors.  FINDINGS: CT HEAD FINDINGS  Mild cerebral atrophy. Patchy and confluent areas of decreased attenuation are noted throughout the deep and periventricular white matter of the cerebral hemispheres bilaterally, compatible with chronic microvascular ischemic disease. 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 No acute intracranial abnormalities. Specifically, no evidence of acute intracranial hemorrhage, no definite findings of acute/subacute cerebral ischemia, no mass, mass effect, hydrocephalus or abnormal intra or extra-axial fluid collections. Visualized paranasal sinuses and mastoids are well pneumatized. No acute displaced skull fractures are identified.  CT CERVICAL SPINE FINDINGS  No acute displaced fracture of the cervical spine. Straightening of normal cervical lordosis,  favored to be positional. Alignment is otherwise anatomic. Multilevel degenerative disc disease, most severe at C5-C6 and C6-C7. Multilevel facet arthropathy. Prevertebral soft tissues are normal. Visualized portions of the upper thorax are unremarkable.  IMPRESSION: 1. No acute abnormality of the cervical spine to account for the patient's symptoms. 2. 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 3. No acute intracranial abnormalities. 4. Mild cerebral atrophy and chronic microvascular ischemic changes throughout the deep and periventricular white matter of the cerebral hemispheres bilaterally. 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   Electronically Signed   By: Vinnie Langton M.D.   On: 01/04/2015 13:46   Ct Cervical Spine Wo Contrast  01/04/2015   CLINICAL DATA:  74 year old female with history of neck pain.  EXAM: CT HEAD WITHOUT CONTRAST  CT CERVICAL SPINE WITHOUT CONTRAST  TECHNIQUE: Multidetector CT imaging of the head and cervical spine was performed following the standard protocol without intravenous contrast. Multiplanar CT image reconstructions of the cervical spine were also generated.  COMPARISON:  No priors.  FINDINGS: CT HEAD FINDINGS  Mild cerebral atrophy. Patchy and confluent areas of decreased attenuation are noted throughout the deep and periventricular white matter of the cerebral hemispheres bilaterally, compatible with chronic microvascular ischemic disease. 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 No acute intracranial abnormalities. Specifically, no evidence of acute intracranial hemorrhage, no definite findings of acute/subacute cerebral ischemia, no mass, mass effect, hydrocephalus or abnormal intra or extra-axial fluid  collections. Visualized paranasal sinuses and mastoids are well pneumatized. No acute displaced skull fractures are identified.  CT CERVICAL SPINE FINDINGS  No acute displaced fracture of the cervical spine. Straightening of normal cervical lordosis, favored to be positional. Alignment is otherwise anatomic. Multilevel degenerative disc disease, most severe at C5-C6 and C6-C7. Multilevel facet arthropathy. Prevertebral soft tissues are normal. Visualized portions of the upper thorax are unremarkable.  IMPRESSION: 1. No acute abnormality of the cervical spine to account for the patient's symptoms. 2. 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 3. No acute intracranial abnormalities. 4. Mild cerebral atrophy and chronic microvascular ischemic changes throughout the deep and periventricular white matter of the cerebral hemispheres bilaterally. 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   Electronically Signed   By: Vinnie Langton M.D.   On: 01/04/2015  13:46   Dg Hip Unilat With Pelvis 2-3 Views Left  01/04/2015   CLINICAL DATA:  Pt from home. Around 1800 last night, pt was sitting at the table. Left foot felt tingly and "asleep." Went to stand up and tripped over chair and fell. C/o left femur pain and lower back pain. Foreshortening of left leg noted, as well as an external rotation. *Best obtainable images due to limited movement of pt.  EXAM: DG HIP (WITH OR WITHOUT PELVIS) 2-3V LEFT  COMPARISON:  None.  FINDINGS: There is an intertrochanteric fracture of the left proximal femur. Distal fracture component is displaced 19 mm lateral to the proximal fracture component. There is mild varus angulation. There is also apex anterior angulation. No significant comminution.  No other fractures. Bones are diffusely demineralized. Hip joints are normally aligned.  Soft tissues show dense iliac  and femoral artery vascular calcifications.  IMPRESSION: Mildly displaced and angulated intertrochanteric fracture of the left proximal femur.   Electronically Signed   By: Lajean Manes M.D.   On: 01/04/2015 14:26   Dg Femur Min 2 Views Left  01/04/2015   CLINICAL DATA:  74 year old female with history of trauma from a fall yesterday complaining of left hip pain.  EXAM: LEFT FEMUR 2 VIEWS  COMPARISON:  No priors.  FINDINGS: Five views of the left femur demonstrate an acute mildly displace and angulated intertrochanteric fracture of the left hip, with approximately 20 degrees of varus angulation. Femoral head appears properly located within the left acetabulum on the frontal projection.  IMPRESSION: 1. Acute minimally displaced mildly angulated intertrochanteric fracture of the left hip, as above.   Electronically Signed   By: Vinnie Langton M.D.   On: 01/04/2015 14:27     EKG Interpretation None      MDM   Final diagnoses:  Neck pain  Hip fracture, left, closed, initial encounter  Hyponatremia    74 yo F with a chief complaint of left hip pain. Patient found to have a acute intertrochanteric fracture. Spoke with orthopedics will take to the OR this afternoon admit to hospitalist.    Deno Etienne, DO 01/04/15 [id number]

## 2015-01-04 NOTE — Anesthesia Preprocedure Evaluation (Addendum)
Anesthesia Evaluation  Patient identified by MRN, date of birth, ID band Patient awake    Reviewed: Allergy & Precautions, H&P , NPO status , Patient's Chart, lab work & pertinent test results, reviewed documented beta blocker date and time   Airway Mallampati: II  TM Distance: >3 FB Neck ROM: Full    Dental no notable dental hx. (+) Teeth Intact, Dental Advisory Given   Pulmonary Current Smoker,  breath sounds clear to auscultation  Pulmonary exam normal       Cardiovascular hypertension, Pt. on medications and Pt. on home beta blockers Rhythm:Regular Rate:Normal     Neuro/Psych negative neurological ROS  negative psych ROS   GI/Hepatic negative GI ROS, Neg liver ROS,   Endo/Other  negative endocrine ROS  Renal/GU negative Renal ROS  negative genitourinary   Musculoskeletal   Abdominal   Peds  Hematology negative hematology ROS (+)   Anesthesia Other Findings   Reproductive/Obstetrics negative OB ROS                            Anesthesia Physical Anesthesia Plan  ASA: II  Anesthesia Plan: General   Post-op Pain Management:    Induction: Intravenous  Airway Management Planned: Oral ETT  Additional Equipment:   Intra-op Plan:   Post-operative Plan: Extubation in OR  Informed Consent: I have reviewed the patients History and Physical, chart, labs and discussed the procedure including the risks, benefits and alternatives for the proposed anesthesia with the patient or authorized representative who has indicated his/her understanding and acceptance.   Dental advisory given  Plan Discussed with: CRNA  Anesthesia Plan Comments:         Anesthesia Quick Evaluation

## 2015-01-04 NOTE — ED Notes (Signed)
Bed: WA21 Expected date:  Expected time:  Means of arrival:  Comments: EMS- Fall, Hip pain

## 2015-01-04 NOTE — H&P (Signed)
Patient Demographics  [name], is a 74 y.o. female  MRN: [id number]   DOB - [birth year]  Admit Date - 01/04/2015  Outpatient Primary MD for the patient is Leonides Sake, MD   With History of -  Past Medical History  Diagnosis Date  . Hypertension   . Hyperlipidemia       Past Surgical History  Procedure Laterality Date  . Appendectomy  1954    in for   Chief Complaint  Patient presents with  . Fall  . Leg Injury  . Hip Injury     HPI  [name]  is a 74 y.o. female, with past medical history of hypertension, presents with mechanical fall and right hip pain, patient reports she tripped , where she had a fall , with significant left hip pain, hip x-ray significant for a fracture, CT head and cervical spine with no acute findings, she denies any dizziness or lightheadedness opreceding the fall, denies any loss of consciousness ,denies any chest pain, shortness of breath, palpitation, fever, workup significant for mild leukocytosis and hyponatremia, denies any cardiac history, hospitalist called to admit.    Review of Systems    In addition to the HPI above,  No Fever-chills, No Headache, No changes with Vision or hearing, No problems swallowing food or Liquids, No Chest pain, Cough or Shortness of Breath, No Abdominal pain, No Nausea or Vommitting, Bowel movements are regular, No Blood in stool or Urine, No dysuria, No new skin rashes or bruises, Complains of left hip pain No new weakness, tingling, numbness in any extremity, No recent weight gain or loss, No polyuria, polydypsia or polyphagia, No significant Mental Stressors.  A full 10 point Review of Systems was done, except as stated above, all other Review of Systems were negative.   Social History History  Substance Use Topics  . Smoking status: Current Every Day Smoker -- 0.50 packs/day    Types: Cigarettes  . Smokeless tobacco: Never Used  . Alcohol Use: 7.0 oz/week    14 drink(s) per week      Family History Family History  Problem Relation Age of Onset  . Colon cancer Father 36     Prior to Admission medications   Medication Sig Start Date End Date Taking? Authorizing Provider  acetaminophen (TYLENOL) 500 MG tablet Take 500 mg by mouth every 6 (six) hours as needed for mild pain or headache.   Yes Historical Provider, MD  amLODipine (NORVASC) 10 MG tablet Take 10 mg by mouth daily.   Yes Historical Provider, MD  aspirin 81 MG tablet Take 81 mg by mouth daily.   Yes Historical Provider, MD  benazepril (LOTENSIN) 20 MG tablet Take 20 mg by mouth daily.   Yes Historical Provider, MD  betamethasone valerate lotion (VALISONE) 0.1 % Apply 1 application topically 2 (two) times daily.   Yes Historical Provider, MD  metoprolol (LOPRESSOR) 50 MG tablet Take 50 mg by mouth daily.   Yes Historical Provider, MD    Allergies  Allergen Reactions  . Codeine Hives    Physical Exam  Vitals  Blood pressure 128/65, pulse 86, temperature 98.1 F (36.7 C), temperature source Oral, resp. rate 17, SpO2 90 %.   1. General well-nourished female lying in bed in NAD,    2. Normal affect and insight, Not Suicidal or Homicidal, Awake Alert, Oriented X 3.  3. No F.N deficits, ALL C.Nerves Intact, Strength 5/5 all 4 extremities, Sensation intact all 4 extremities, Plantars down  going.  4. Ears and Eyes appear Normal, Conjunctivae clear, PERRLA. Moist Oral Mucosa.  5. Supple Neck, No JVD, No cervical lymphadenopathy appriciated, No Carotid Bruits.  6. Symmetrical Chest wall movement, Good air movement bilaterally, CTAB.  7. RRR, No Gallops, Rubs or Murmurs, No Parasternal Heave.  8. Positive Bowel Sounds, Abdomen Soft, No tenderness, No organomegaly appriciated,No rebound -guarding or rigidity.  9.  No Cyanosis, Normal Skin Turgor, No Skin Rash or Bruise.  10. Good muscle tone,  joints appear normal , no effusions, left hip tenderness and pain on movement  11. No Palpable Lymph  Nodes in Neck or Axillae    Data Review  CBC  Recent Labs Lab 01/04/15 1430 01/04/15 1441  WBC 14.9*  --   HGB 12.7 13.6  HCT 36.5 40.0  PLT 264  --   MCV 93.1  --   MCH 32.4  --   MCHC 34.8  --   RDW 13.1  --   LYMPHSABS 1.6  --   MONOABS 1.3*  --   EOSABS 0.0  --   BASOSABS 0.0  --    ------------------------------------------------------------------------------------------------------------------  Chemistries   Recent Labs Lab 01/04/15 1430 01/04/15 1441  NA 130* 129*  K 4.2 4.2  CL 95* 94*  CO2 26  --   GLUCOSE 118* 122*  BUN 13 13  CREATININE 0.71 0.70  CALCIUM 9.1  --   AST 21  --   ALT 20  --   ALKPHOS 71  --   BILITOT 0.8  --    ------------------------------------------------------------------------------------------------------------------ CrCl cannot be calculated (Unknown ideal weight.). ------------------------------------------------------------------------------------------------------------------ No results for input(s): TSH, T4TOTAL, T3FREE, THYROIDAB in the last 72 hours.  Invalid input(s): FREET3   Coagulation profile No results for input(s): INR, PROTIME in the last 168 hours. ------------------------------------------------------------------------------------------------------------------- No results for input(s): DDIMER in the last 72 hours. -------------------------------------------------------------------------------------------------------------------  Cardiac Enzymes No results for input(s): CKMB, TROPONINI, MYOGLOBIN in the last 168 hours.  Invalid input(s): CK ------------------------------------------------------------------------------------------------------------------ Invalid input(s): POCBNP   ---------------------------------------------------------------------------------------------------------------  Urinalysis No results found for: COLORURINE, APPEARANCEUR, LABSPEC, [place], GLUCOSEU, HGBUR, BILIRUBINUR,  KETONESUR, PROTEINUR, UROBILINOGEN, NITRITE, LEUKOCYTESUR  ----------------------------------------------------------------------------------------------------------------  Imaging results:   Dg Chest 1 View  01/04/2015   CLINICAL DATA:  74 year old female with history of trauma from a fall yesterday evening.  EXAM: CHEST  1 VIEW  COMPARISON:  No priors.  FINDINGS: Lung volumes are normal. No consolidative airspace disease. No pleural effusions. No pneumothorax. No evidence of pulmonary edema. Heart size is borderline enlarged. The patient is rotated to the right on today's exam, resulting in distortion of the mediastinal contours and reduced diagnostic sensitivity and specificity for mediastinal pathology. Visualized bony thorax is grossly intact. Old healed fractures of the lateral aspects of the right seventh and eighth ribs.  IMPRESSION: 1. No radiographic evidence of acute cardiopulmonary disease.   Electronically Signed   By: Vinnie Langton M.D.   On: 01/04/2015 14:25   Dg Knee 2 Views Left  01/04/2015   CLINICAL DATA:  Pt from home. Around 1800 last night, pt was sitting at the table. Left foot felt tingly and "asleep." Went to stand up and tripped over chair and fell. C/o left femur pain and lower back pain. Foreshortening of left leg noted, as well as an external rotation. *Best obtainable images due to limited movement of pt.  EXAM: LEFT KNEE - 1-2 VIEW  COMPARISON:  None.  FINDINGS: No fracture. There is mild medial joint space compartment narrowing. No other arthropathic change.  Bone to demineralized. No bone lesion.  No joint effusion.  Vascular calcifications are noted posteriorly.  IMPRESSION: No fracture or acute finding.   Electronically Signed   By: Lajean Manes M.D.   On: 01/04/2015 14:27   Ct Head Wo Contrast  01/04/2015   CLINICAL DATA:  74 year old female with history of neck pain.  EXAM: CT HEAD WITHOUT CONTRAST  CT CERVICAL SPINE WITHOUT CONTRAST  TECHNIQUE: Multidetector CT  imaging of the head and cervical spine was performed following the standard protocol without intravenous contrast. Multiplanar CT image reconstructions of the cervical spine were also generated.  COMPARISON:  No priors.  FINDINGS: CT HEAD FINDINGS  Mild cerebral atrophy. Patchy and confluent areas of decreased attenuation are noted throughout the deep and periventricular white matter of the cerebral hemispheres bilaterally, compatible with chronic microvascular ischemic disease. 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 No acute intracranial abnormalities. Specifically, no evidence of acute intracranial hemorrhage, no definite findings of acute/subacute cerebral ischemia, no mass, mass effect, hydrocephalus or abnormal intra or extra-axial fluid collections. Visualized paranasal sinuses and mastoids are well pneumatized. No acute displaced skull fractures are identified.  CT CERVICAL SPINE FINDINGS  No acute displaced fracture of the cervical spine. Straightening of normal cervical lordosis, favored to be positional. Alignment is otherwise anatomic. Multilevel degenerative disc disease, most severe at C5-C6 and C6-C7. Multilevel facet arthropathy. Prevertebral soft tissues are normal. Visualized portions of the upper thorax are unremarkable.  IMPRESSION: 1. No acute abnormality of the cervical spine to account for the patient's symptoms. 2. 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 3. No acute intracranial abnormalities. 4. Mild cerebral atrophy and chronic microvascular ischemic changes throughout the deep and periventricular white matter of the cerebral hemispheres bilaterally. 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   Electronically Signed   By:  Vinnie Langton M.D.   On: 01/04/2015 13:46   Ct Cervical Spine Wo Contrast  01/04/2015   CLINICAL DATA:  74 year old female with history of neck pain.  EXAM: CT HEAD WITHOUT CONTRAST  CT CERVICAL SPINE WITHOUT CONTRAST  TECHNIQUE: Multidetector CT imaging of the head and cervical spine was performed following the standard protocol without intravenous contrast. Multiplanar CT image reconstructions of the cervical spine were also generated.  COMPARISON:  No priors.  FINDINGS: CT HEAD FINDINGS  Mild cerebral atrophy. Patchy and confluent areas of decreased attenuation are noted throughout the deep and periventricular white matter of the cerebral hemispheres bilaterally, compatible with chronic microvascular ischemic disease. 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 No acute intracranial abnormalities. Specifically, no evidence of acute intracranial hemorrhage, no definite findings of acute/subacute cerebral ischemia, no mass, mass effect, hydrocephalus or abnormal intra or extra-axial fluid collections. Visualized paranasal sinuses and mastoids are well pneumatized. No acute displaced skull fractures are identified.  CT CERVICAL SPINE FINDINGS  No acute displaced fracture of the cervical spine. Straightening of normal cervical lordosis, favored to be positional. Alignment is otherwise anatomic. Multilevel degenerative disc disease, most severe at C5-C6 and C6-C7. Multilevel facet arthropathy. Prevertebral soft tissues are normal. Visualized portions of the upper thorax are unremarkable.  IMPRESSION: 1. No  acute abnormality of the cervical spine to account for the patient's symptoms. 2. 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 3. No acute intracranial abnormalities. 4. Mild cerebral atrophy and chronic microvascular ischemic changes  throughout the deep and periventricular white matter of the cerebral hemispheres bilaterally. 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   Electronically Signed   By: Vinnie Langton M.D.   On: 01/04/2015 13:46   Dg Hip Unilat With Pelvis 2-3 Views Left  01/04/2015   CLINICAL DATA:  Pt from home. Around 1800 last night, pt was sitting at the table. Left foot felt tingly and "asleep." Went to stand up and tripped over chair and fell. C/o left femur pain and lower back pain. Foreshortening of left leg noted, as well as an external rotation. *Best obtainable images due to limited movement of pt.  EXAM: DG HIP (WITH OR WITHOUT PELVIS) 2-3V LEFT  COMPARISON:  None.  FINDINGS: There is an intertrochanteric fracture of the left proximal femur. Distal fracture component is displaced 19 mm lateral to the proximal fracture component. There is mild varus angulation. There is also apex anterior angulation. No significant comminution.  No other fractures. Bones are diffusely demineralized. Hip joints are normally aligned.  Soft tissues show dense iliac and femoral artery vascular calcifications.  IMPRESSION: Mildly displaced and angulated intertrochanteric fracture of the left proximal femur.   Electronically Signed   By: Lajean Manes M.D.   On: 01/04/2015 14:26   Dg Femur Min 2 Views Left  01/04/2015   CLINICAL DATA:  74 year old female with history of trauma from a fall yesterday complaining of left hip pain.  EXAM: LEFT FEMUR 2 VIEWS  COMPARISON:  No priors.  FINDINGS: Five views of the left femur demonstrate an acute mildly displace and angulated intertrochanteric fracture of the left hip, with approximately 20 degrees of varus angulation. Femoral head appears properly located within the left acetabulum on the frontal projection.  IMPRESSION: 1. Acute minimally displaced mildly angulated intertrochanteric fracture of the left hip, as  above.   Electronically Signed   By: Vinnie Langton M.D.   On: 01/04/2015 14:27    My personal review of EKG: Rhythm NSR, Rate 83  /min, QTc 455 , no Acute ST changes    Assessment & Plan  Principal Problem:   Hip fracture Active Problems:   Hyponatremia   HTN (hypertension)   Leukocytosis    Left hip fracture - Secondary to mechanical fall, patient denies any chest pain, or shortness of breath, or palpitation, EKG nonacute, no significant labs abnormality, patient is moderate risk for surgery, no further workup needed prior to surgery. - Continue with beta blockers perioperatively ,  reports she took her metoprolol this a.m.Marland Kitchen - Insert Foley catheter - Continue with when necessary pain and nausea and laxity of medication. - Seen by orthopedic, plan is to go for OR later today  Hypertension - Pressure is acceptable, continue with home medication  Hyponatremia - Mild, continue with IV fluids.  Leukocytosis - Stress related from hip fracture, check urine analysis     DVT Prophylaxis Heparin - AM Labs Ordered, also please review Full Orders  Family Communication: Admission, patients condition and plan of care including tests being ordered have been discussed with the patient and sister in law at bedside who indicate understanding and agree with the plan and Code Status.  Code  Status Full  Likely DC to  depending on PT evaluation postop  Condition GUARDED    Time spent in minutes :  55 minutes     Estanislao Harmon M.D on 01/04/2015 at 3:27 PM  Between 7am to 7pm - Pager - [phone]  After 7pm go to [email or web address] - password [email or web address]  And look for the night coverage person covering me after hours  [facility] Office  [phone]

## 2015-01-04 NOTE — ED Notes (Signed)
Nurse drawing labs. 

## 2015-01-04 NOTE — ED Notes (Signed)
Per EMS-pt from home. Around 1800 last night was sitting at the table left foot felt tingly and "asleep." Went to stand up and tripped over chair and fell. Didn't hit head and denies LOC. C/o left femur pain and lower back pain. No deformities noted. C-collar in place. A&Ox4. + Pedal pulses. Some shortening of left leg noted. Pain increased with movement. No splint in place. 20 G PIV in left hand. SR on monitor.  Allergic: codeine, calcium channel blockers. VS: BP 102/70, 116/80 HR 81 SpO2 94% on RA.

## 2015-01-04 NOTE — ED Notes (Signed)
Admitting doctors at the bedside 

## 2015-01-04 NOTE — Transfer of Care (Signed)
Immediate Anesthesia Transfer of Care Note  Patient: [name]  Procedure(s) Performed: Procedure(s): INTRAMEDULLARY (IM) NAIL INTERTROCHANTRIC (Left)  Patient Location: PACU  Anesthesia Type:General  Level of Consciousness: awake, alert , oriented and patient cooperative  Airway & Oxygen Therapy: Patient Spontanous Breathing and Patient connected to face mask oxygen  Post-op Assessment: Report given to RN and Post -op Vital signs reviewed and stable  Post vital signs: Reviewed and stable  Last Vitals:  Filed Vitals:   01/04/15 1416  BP: 128/65  Pulse: 86  Temp:   Resp: 17    Complications: No apparent anesthesia complications

## 2015-01-05 LAB — CBC
HEMATOCRIT: 31.6 % — AB (ref 36.0–46.0)
Hemoglobin: 10.9 g/dL — ABNORMAL LOW (ref 12.0–15.0)
MCH: 32.6 pg (ref 26.0–34.0)
MCHC: 34.5 g/dL (ref 30.0–36.0)
MCV: 94.6 fL (ref 78.0–100.0)
PLATELETS: 214 10*3/uL (ref 150–400)
RBC: 3.34 MIL/uL — AB (ref 3.87–5.11)
RDW: 12.9 % (ref 11.5–15.5)
WBC: 9.7 10*3/uL (ref 4.0–10.5)

## 2015-01-05 LAB — BASIC METABOLIC PANEL
ANION GAP: 6 (ref 5–15)
BUN: 9 mg/dL (ref 6–20)
CO2: 28 mmol/L (ref 22–32)
CREATININE: 0.61 mg/dL (ref 0.44–1.00)
Calcium: 8.3 mg/dL — ABNORMAL LOW (ref 8.9–10.3)
Chloride: 98 mmol/L — ABNORMAL LOW (ref 101–111)
GFR calc Af Amer: >60 mL/min (ref >60)
GLUCOSE: 158 mg/dL — AB (ref 65–99)
POTASSIUM: 4.2 mmol/L (ref 3.5–5.1)
Sodium: 132 mmol/L — ABNORMAL LOW (ref 135–145)

## 2015-01-05 MED ORDER — FERROUS SULFATE 325 (65 FE) MG PO TABS
325.0000 mg | ORAL_TABLET | Freq: Three times a day (TID) | ORAL | Status: DC
  Administered 2015-01-05 – 2015-01-07 (×7): 325 mg via ORAL
  Filled 2015-01-05 (×10): qty 1

## 2015-01-05 MED ORDER — BENAZEPRIL HCL 20 MG PO TABS
20.0000 mg | ORAL_TABLET | Freq: Every day | ORAL | Status: DC
  Administered 2015-01-05: 20 mg via ORAL
  Filled 2015-01-05 (×3): qty 1

## 2015-01-05 MED ORDER — ONDANSETRON HCL 4 MG/2ML IJ SOLN: 4.0000 mg | Freq: Four times a day (QID) | INTRAMUSCULAR | Status: DC | PRN

## 2015-01-05 MED ORDER — METOCLOPRAMIDE HCL 5 MG/ML IJ SOLN: 5.0000 mg | Freq: Three times a day (TID) | INTRAMUSCULAR | Status: DC | PRN

## 2015-01-05 MED ORDER — HYDROMORPHONE HCL 1 MG/ML IJ SOLN: 0.5000 mg | INTRAMUSCULAR | Status: DC | PRN

## 2015-01-05 MED ORDER — POLYETHYLENE GLYCOL 3350 17 G PO PACK: 17.0000 g | PACK | Freq: Every day | ORAL | Status: DC | PRN

## 2015-01-05 MED ORDER — MENTHOL 3 MG MT LOZG: 1.0000 | LOZENGE | OROMUCOSAL | Status: DC | PRN

## 2015-01-05 MED ORDER — SODIUM CHLORIDE 0.9 % IV SOLN
INTRAVENOUS | Status: DC
  Administered 2015-01-05: 18:00:00 via INTRAVENOUS
  Administered 2015-01-05: 1000 mL via INTRAVENOUS

## 2015-01-05 MED ORDER — HEPARIN SODIUM (PORCINE) 5000 UNIT/ML IJ SOLN: 5000.0000 [IU] | Freq: Three times a day (TID) | INTRAMUSCULAR | Status: DC

## 2015-01-05 MED ORDER — ENOXAPARIN SODIUM 40 MG/0.4ML ~~LOC~~ SOLN
40.0000 mg | SUBCUTANEOUS | Status: DC
  Administered 2015-01-05 – 2015-01-07 (×3): 40 mg via SUBCUTANEOUS
  Filled 2015-01-05 (×4): qty 0.4

## 2015-01-05 MED ORDER — MAGNESIUM CITRATE PO SOLN: 1.0000 | Freq: Once | ORAL | Status: AC | PRN

## 2015-01-05 MED ORDER — ASPIRIN EC 81 MG PO TBEC
81.0000 mg | DELAYED_RELEASE_TABLET | Freq: Every day | ORAL | Status: DC
  Administered 2015-01-05 – 2015-01-07 (×3): 81 mg via ORAL
  Filled 2015-01-05 (×3): qty 1

## 2015-01-05 MED ORDER — METOCLOPRAMIDE HCL 10 MG PO TABS: 5.0000 mg | ORAL_TABLET | Freq: Three times a day (TID) | ORAL | Status: DC | PRN

## 2015-01-05 MED ORDER — ACETAMINOPHEN 325 MG PO TABS
650.0000 mg | ORAL_TABLET | Freq: Four times a day (QID) | ORAL | Status: DC | PRN
  Administered 2015-01-05: 650 mg via ORAL
  Filled 2015-01-05: qty 2

## 2015-01-05 MED ORDER — ONDANSETRON HCL 4 MG PO TABS: 4.0000 mg | ORAL_TABLET | Freq: Four times a day (QID) | ORAL | Status: DC | PRN

## 2015-01-05 MED ORDER — DOCUSATE SODIUM 100 MG PO CAPS
100.0000 mg | ORAL_CAPSULE | Freq: Two times a day (BID) | ORAL | Status: DC
  Administered 2015-01-05 – 2015-01-07 (×6): 100 mg via ORAL

## 2015-01-05 MED ORDER — DOCUSATE SODIUM 100 MG PO CAPS: 100.0000 mg | ORAL_CAPSULE | Freq: Two times a day (BID) | ORAL | Status: DC

## 2015-01-05 MED ORDER — PHENOL 1.4 % MT LIQD
1.0000 | OROMUCOSAL | Status: DC | PRN
  Filled 2015-01-05: qty 177

## 2015-01-05 MED ORDER — BISACODYL 10 MG RE SUPP
10.0000 mg | Freq: Every day | RECTAL | Status: DC | PRN
  Administered 2015-01-06: 10 mg via RECTAL
  Filled 2015-01-05: qty 1

## 2015-01-05 MED ORDER — BISACODYL 10 MG RE SUPP: 10.0000 mg | Freq: Every day | RECTAL | Status: DC | PRN

## 2015-01-05 MED ORDER — MORPHINE SULFATE 2 MG/ML IJ SOLN: 1.0000 mg | INTRAMUSCULAR | Status: DC | PRN

## 2015-01-05 MED ORDER — ACETAMINOPHEN 650 MG RE SUPP: 650.0000 mg | Freq: Four times a day (QID) | RECTAL | Status: DC | PRN

## 2015-01-05 MED ORDER — METOPROLOL TARTRATE 50 MG PO TABS
50.0000 mg | ORAL_TABLET | Freq: Every day | ORAL | Status: DC
  Administered 2015-01-05 – 2015-01-07 (×3): 50 mg via ORAL
  Filled 2015-01-05 (×3): qty 1

## 2015-01-05 MED ORDER — POLYETHYLENE GLYCOL 3350 17 G PO PACK
17.0000 g | PACK | Freq: Every day | ORAL | Status: DC | PRN
  Administered 2015-01-05: 17 g via ORAL
  Filled 2015-01-05: qty 1

## 2015-01-05 MED ORDER — HYDROCODONE-ACETAMINOPHEN 5-325 MG PO TABS
1.0000 | ORAL_TABLET | Freq: Four times a day (QID) | ORAL | Status: DC | PRN
  Administered 2015-01-05: 2 via ORAL
  Administered 2015-01-05: 1 via ORAL
  Administered 2015-01-05: 2 via ORAL
  Administered 2015-01-06 – 2015-01-07 (×5): 1 via ORAL
  Filled 2015-01-05: qty 1
  Filled 2015-01-05: qty 2
  Filled 2015-01-05: qty 1
  Filled 2015-01-05: qty 2
  Filled 2015-01-05: qty 1
  Filled 2015-01-05: qty 2
  Filled 2015-01-05 (×2): qty 1

## 2015-01-05 MED ORDER — CEFAZOLIN SODIUM-DEXTROSE 2-3 GM-% IV SOLR
2.0000 g | Freq: Four times a day (QID) | INTRAVENOUS | Status: AC
  Administered 2015-01-05 (×2): 2 g via INTRAVENOUS
  Filled 2015-01-05 (×2): qty 50

## 2015-01-05 MED ORDER — AMLODIPINE BESYLATE 10 MG PO TABS
10.0000 mg | ORAL_TABLET | Freq: Every day | ORAL | Status: DC
  Administered 2015-01-05 – 2015-01-07 (×2): 10 mg via ORAL
  Filled 2015-01-05 (×3): qty 1

## 2015-01-05 NOTE — Progress Notes (Signed)
Patient Demographics  [name], is a 74 y.o. female, DOB - Jan 17, 1941, TFT:[id number]  Admit date - 01/04/2015   Admitting Physician Albertine Patricia, MD  Outpatient Primary MD for the patient is Leonides Sake, MD  LOS - 1   Chief Complaint  Patient presents with  . Fall  . Leg Injury  . Hip Injury       Admission HPI/Brief narrative: 74 y.o. female, with past medical history of hypertension, presents with mechanical fall and right hip fracture, status post surgical repair 8/6.  Subjective:   [name] today has, No headache, No chest pain, No abdominal pain - No Nausea, No new weakness tingling or numbness, No Cough - SOB.   Assessment & Plan    Principal Problem:   Hip fracture Active Problems:   Hyponatremia   HTN (hypertension)   Leukocytosis   Hip fracture requiring operative repair  Left hip fracture - Secondary to mechanical fall,  - postsurgical repair 01/04/15 by Dr. Veverly Fells - Will need SNF placement  Hypertension - Blood Pressure is acceptable, continue with home medication  Hyponatremia - Mild, continue with IV fluids.  Leukocytosis - Stress related from hip fracture, resolved  Code Status: full  Family Communication: none at bedside  Disposition Plan: SNF   Procedures  Left hip surgical repair by Dr. Veverly Fells 8/6   Consults   Orthopedic   Medications  Scheduled Meds: . amLODipine  10 mg Oral Daily  . aspirin EC  81 mg Oral Daily  . benazepril  20 mg Oral Daily  . docusate sodium  100 mg Oral BID  . enoxaparin (LOVENOX) injection  40 mg Subcutaneous Q24H  . ferrous sulfate  325 mg Oral TID PC  . metoprolol  50 mg Oral Daily   Continuous Infusions: . sodium chloride 1,000 mL (01/05/15 0225)   PRN Meds:.acetaminophen **OR** acetaminophen, bisacodyl, HYDROcodone-acetaminophen, HYDROmorphone (DILAUDID) injection, magnesium citrate,  menthol-cetylpyridinium **OR** phenol, methocarbamol **OR** methocarbamol (ROBAXIN)  IV, metoCLOPramide **OR** metoCLOPramide (REGLAN) injection, ondansetron **OR** ondansetron (ZOFRAN) IV, polyethylene glycol  DVT Prophylaxis  Lovenox -   Lab Results  Component Value Date   PLT 214 01/05/2015    Antibiotics    Anti-infectives    Start     Dose/Rate Route Frequency Ordered Stop   01/05/15 0145  ceFAZolin (ANCEF) IVPB 2 g/50 mL premix     2 g 100 mL/hr over 30 Minutes Intravenous Every 6 hours 01/05/15 0146 01/05/15 0836   01/04/15 1538  ceFAZolin (ANCEF) IVPB 2 g/50 mL premix     2 g 100 mL/hr over 30 Minutes Intravenous 30 min pre-op 01/04/15 1533 01/04/15 1715          Objective:   Filed Vitals:   01/05/15 0140 01/05/15 0435 01/05/15 1008 01/05/15 1434  BP: 106/66 114/60 116/57 88/44  Pulse: 103 102 114 86  Temp: 98.2 F (36.8 C) 98.2 F (36.8 C)  98.3 F (36.8 C)  TempSrc: Oral Oral  Oral  Resp: 16 20  16   Height:   5\' 1"  (1.549 m)   Weight:   68.04 kg (150 lb)   SpO2: 93% 100%  100%    Wt Readings from Last 3 Encounters:  01/05/15 68.04 kg (150 lb)  03/02/13 71.215  kg (157 lb)  02/15/13 71.396 kg (157 lb 6.4 oz)     Intake/Output Summary (Last 24 hours) at 01/05/15 1604 Last data filed at 01/05/15 1347  Gross per 24 hour  Intake   2685 ml  Output   2075 ml  Net    610 ml     Physical Exam  Awake Alert, Oriented X 3, No new F.N deficits, Normal affect [place].AT,PERRAL Supple Neck,No JVD, No cervical lymphadenopathy appriciated.  Symmetrical Chest wall movement, Good air movement bilaterally, CTAB RRR,No Gallops,Rubs or new Murmurs, No Parasternal Heave +ve B.Sounds, Abd Soft, No tenderness, No organomegaly appriciated, No rebound - guarding or rigidity. No Cyanosis, Clubbing or edema, No new Rash or bruise ,    Data Review   Micro Results No results found for this or any previous visit (from the past 240 hour(s)).  Radiology Reports Dg Chest 1  View  01/04/2015   CLINICAL DATA:  75 year old female with history of trauma from a fall yesterday evening.  EXAM: CHEST  1 VIEW  COMPARISON:  No priors.  FINDINGS: Lung volumes are normal. No consolidative airspace disease. No pleural effusions. No pneumothorax. No evidence of pulmonary edema. Heart size is borderline enlarged. The patient is rotated to the right on today's exam, resulting in distortion of the mediastinal contours and reduced diagnostic sensitivity and specificity for mediastinal pathology. Visualized bony thorax is grossly intact. Old healed fractures of the lateral aspects of the right seventh and eighth ribs.  IMPRESSION: 1. No radiographic evidence of acute cardiopulmonary disease.   Electronically Signed   By: Vinnie Langton M.D.   On: 01/04/2015 14:25   Dg Knee 2 Views Left  01/04/2015   CLINICAL DATA:  Pt from home. Around 1800 last night, pt was sitting at the table. Left foot felt tingly and "asleep." Went to stand up and tripped over chair and fell. C/o left femur pain and lower back pain. Foreshortening of left leg noted, as well as an external rotation. *Best obtainable images due to limited movement of pt.  EXAM: LEFT KNEE - 1-2 VIEW  COMPARISON:  None.  FINDINGS: No fracture. There is mild medial joint space compartment narrowing. No other arthropathic change. Bone to demineralized. No bone lesion.  No joint effusion.  Vascular calcifications are noted posteriorly.  IMPRESSION: No fracture or acute finding.   Electronically Signed   By: Lajean Manes M.D.   On: 01/04/2015 14:27   Ct Head Wo Contrast  01/04/2015   CLINICAL DATA:  74 year old female with history of neck pain.  EXAM: CT HEAD WITHOUT CONTRAST  CT CERVICAL SPINE WITHOUT CONTRAST  TECHNIQUE: Multidetector CT imaging of the head and cervical spine was performed following the standard protocol without intravenous contrast. Multiplanar CT image reconstructions of the cervical spine were also generated.  COMPARISON:  No  priors.  FINDINGS: CT HEAD FINDINGS  Mild cerebral atrophy. Patchy and confluent areas of decreased attenuation are noted throughout the deep and periventricular white matter of the cerebral hemispheres bilaterally, compatible with chronic microvascular ischemic disease. 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 No acute intracranial abnormalities. Specifically, no evidence of acute intracranial hemorrhage, no definite findings of acute/subacute cerebral ischemia, no mass, mass effect, hydrocephalus or abnormal intra or extra-axial fluid collections. Visualized paranasal sinuses and mastoids are well pneumatized. No acute displaced skull fractures are identified.  CT CERVICAL SPINE FINDINGS  No acute displaced fracture of the cervical spine. Straightening of normal cervical lordosis, favored to be positional. Alignment is otherwise anatomic. Multilevel degenerative disc disease, most severe at C5-C6 and C6-C7. Multilevel facet arthropathy. Prevertebral soft tissues are normal. Visualized portions of the upper thorax are unremarkable.  IMPRESSION: 1. No acute abnormality of the cervical spine to account for the patient's symptoms. 2. 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 3. No acute intracranial abnormalities. 4. Mild cerebral atrophy and chronic microvascular ischemic changes throughout the deep and periventricular white matter of the cerebral hemispheres bilaterally. 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   Electronically Signed   By: Vinnie Langton M.D.   On: 01/04/2015 13:46   Ct Cervical Spine Wo Contrast  01/04/2015   CLINICAL DATA:  74 year old female with history of neck pain.  EXAM: CT HEAD WITHOUT CONTRAST  CT CERVICAL SPINE  WITHOUT CONTRAST  TECHNIQUE: Multidetector CT imaging of the head and cervical spine was performed following the standard protocol without intravenous contrast. Multiplanar CT image reconstructions of the cervical spine were also generated.  COMPARISON:  No priors.  FINDINGS: CT HEAD FINDINGS  Mild cerebral atrophy. Patchy and confluent areas of decreased attenuation are noted throughout the deep and periventricular white matter of the cerebral hemispheres bilaterally, compatible with chronic microvascular ischemic disease. 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 No acute intracranial abnormalities. Specifically, no evidence of acute intracranial hemorrhage, no definite findings of acute/subacute cerebral ischemia, no mass, mass effect, hydrocephalus or abnormal intra or extra-axial fluid collections. Visualized paranasal sinuses and mastoids are well pneumatized. No acute displaced skull fractures are identified.  CT CERVICAL SPINE FINDINGS  No acute displaced fracture of the cervical spine. Straightening of normal cervical lordosis, favored to be positional. Alignment is otherwise anatomic. Multilevel degenerative disc disease, most severe at C5-C6 and C6-C7. Multilevel facet arthropathy. Prevertebral soft tissues are normal. Visualized portions of the upper thorax are unremarkable.  IMPRESSION: 1. No acute abnormality of the cervical spine to account for the patient's symptoms. 2. 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 3. No acute intracranial abnormalities. 4. Mild cerebral atrophy and chronic microvascular ischemic changes throughout the deep and periventricular white matter of the cerebral hemispheres bilaterally. 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   Electronically Signed   By: Vinnie Langton M.D.   On: 01/04/2015 13:46   Pelvis Portable  01/04/2015   CLINICAL DATA:  Patient status post intra medullary nail proximal left femur.  EXAM: OPERATIVE LEFT HIP (WITH PELVIS IF PERFORMED) to VIEWS  TECHNIQUE: Fluoroscopic spot image(s) were submitted for interpretation post-operatively.  COMPARISON:  Radiograph 01/04/2015  FINDINGS: Patient status post intra medullary rod fixation of previously described left intratrochanteric femur fracture. Soft tissue gas and overlying staple line. Fracture appears in improved anatomic alignment. Lumbar spine degenerative changes. Right hip joint degenerative changes.  IMPRESSION: Patient status post ORIF left femoral neck fracture.   Electronically Signed   By: Lovey Newcomer M.D.   On: 01/04/2015 18:58   Dg Hip Operative Unilat With Pelvis Left  01/04/2015   CLINICAL DATA:  Patient status post intra medullary nail  proximal left femur.  EXAM: OPERATIVE LEFT HIP (WITH PELVIS IF PERFORMED) to VIEWS  TECHNIQUE: Fluoroscopic spot image(s) were submitted for interpretation post-operatively.  COMPARISON:  Radiograph 01/04/2015  FINDINGS: Patient status post intra medullary rod fixation of previously described left intratrochanteric femur fracture. Soft tissue gas and overlying staple line. Fracture appears in improved anatomic alignment. Lumbar spine degenerative changes. Right hip joint degenerative changes.  IMPRESSION: Patient status post ORIF left femoral neck fracture.   Electronically Signed   By: Lovey Newcomer M.D.   On: 01/04/2015 18:58   Dg Hip Unilat With Pelvis 2-3 Views Left  01/04/2015   CLINICAL DATA:  Pt from home. Around 1800 last night, pt was sitting at the table. Left foot felt tingly and "asleep." Went to stand up and tripped over chair and fell. C/o left femur pain and lower back pain. Foreshortening of left leg noted, as well as an external rotation. *Best obtainable images due to limited movement  of pt.  EXAM: DG HIP (WITH OR WITHOUT PELVIS) 2-3V LEFT  COMPARISON:  None.  FINDINGS: There is an intertrochanteric fracture of the left proximal femur. Distal fracture component is displaced 19 mm lateral to the proximal fracture component. There is mild varus angulation. There is also apex anterior angulation. No significant comminution.  No other fractures. Bones are diffusely demineralized. Hip joints are normally aligned.  Soft tissues show dense iliac and femoral artery vascular calcifications.  IMPRESSION: Mildly displaced and angulated intertrochanteric fracture of the left proximal femur.   Electronically Signed   By: Lajean Manes M.D.   On: 01/04/2015 14:26   Dg Femur Min 2 Views Left  01/04/2015   CLINICAL DATA:  74 year old female with history of trauma from a fall yesterday complaining of left hip pain.  EXAM: LEFT FEMUR 2 VIEWS  COMPARISON:  No priors.  FINDINGS: Five views of the left femur demonstrate an acute mildly displace and angulated intertrochanteric fracture of the left hip, with approximately 20 degrees of varus angulation. Femoral head appears properly located within the left acetabulum on the frontal projection.  IMPRESSION: 1. Acute minimally displaced mildly angulated intertrochanteric fracture of the left hip, as above.   Electronically Signed   By: Vinnie Langton M.D.   On: 01/04/2015 14:27     CBC  Recent Labs Lab 01/04/15 1430 01/04/15 1441 01/05/15 0443  WBC 14.9*  --  9.7  HGB 12.7 13.6 10.9*  HCT 36.5 40.0 31.6*  PLT 264  --  214  MCV 93.1  --  94.6  MCH 32.4  --  32.6  MCHC 34.8  --  34.5  RDW 13.1  --  12.9  LYMPHSABS 1.6  --   --   MONOABS 1.3*  --   --   EOSABS 0.0  --   --   BASOSABS 0.0  --   --     Chemistries   Recent Labs Lab 01/04/15 1430 01/04/15 1441 01/05/15 0443  NA 130* 129* 132*  K 4.2 4.2 4.2  CL 95* 94* 98*  CO2 26  --  28  GLUCOSE 118* 122* 158*  BUN 13 13 9   CREATININE 0.71 0.70 0.61  CALCIUM 9.1  --  8.3*  AST 21   --   --   ALT 20  --   --   ALKPHOS 71  --   --   BILITOT 0.8  --   --    ------------------------------------------------------------------------------------------------------------------ estimated creatinine clearance is 56.1 mL/min (by C-G formula based  on Cr of 0.61). ------------------------------------------------------------------------------------------------------------------ No results for input(s): HGBA1C in the last 72 hours. ------------------------------------------------------------------------------------------------------------------ No results for input(s): CHOL, HDL, LDLCALC, TRIG, CHOLHDL, LDLDIRECT in the last 72 hours. ------------------------------------------------------------------------------------------------------------------ No results for input(s): TSH, T4TOTAL, T3FREE, THYROIDAB in the last 72 hours.  Invalid input(s): FREET3 ------------------------------------------------------------------------------------------------------------------ No results for input(s): VITAMINB12, FOLATE, FERRITIN, TIBC, IRON, RETICCTPCT in the last 72 hours.  Coagulation profile No results for input(s): INR, PROTIME in the last 168 hours.  No results for input(s): DDIMER in the last 72 hours.  Cardiac Enzymes No results for input(s): CKMB, TROPONINI, MYOGLOBIN in the last 168 hours.  Invalid input(s): CK ------------------------------------------------------------------------------------------------------------------ Invalid input(s): POCBNP     Time Spent in minutes   25 minutes   Raisa Ditto M.D on 01/05/2015 at 4:04 PM  Between 7am to 7pm - Pager - [phone]  After 7pm go to [email or web address] - password [facility]  [facility]   Office  [phone]

## 2015-01-05 NOTE — Evaluation (Signed)
Occupational Therapy Evaluation Patient Details Name: [name] MRN: [id number] DOB: March 26, 1941 Today's Date: 01/05/2015    History of Present Illness s/p L IM nail   Clinical Impression   This 74 year old female was admitted for the above surgery.  She was independent with adls prior to admission and needs min A +2 for safety for SPT and up to max A +2 for safety for adls.  She will benefit from skilled OT in acute setting and follow up at SNF.  Goals in acute are for supervision level, with AE observing PWB (25%) restriction.    Follow Up Recommendations  SNF    Equipment Recommendations  3 in 1 bedside comode    Recommendations for Other Services       Precautions / Restrictions Precautions Precautions: Fall Restrictions Weight Bearing Restrictions: Yes LLE Weight Bearing: Partial weight bearing LLE Partial Weight Bearing Percentage or Pounds: 25%      Mobility Bed Mobility Overal bed mobility: Needs Assistance;+2 for physical assistance Bed Mobility: Supine to Sit     Supine to sit: Mod assist;+2 for physical assistance     General bed mobility comments: assist for LLE and trunk; used bedrail  Transfers Overall transfer level: Needs assistance Equipment used: Rolling walker (2 wheeled) Transfers: Sit to/from Stand Sit to Stand: Min assist;+2 safety/equipment         General transfer comment: assist to rise and steady from high bed.  Cues for UE/LE placement    Balance                                            ADL Overall ADL's : Needs assistance/impaired             Lower Body Bathing: Moderate assistance;+2 for safety/equipment;Sit to/from stand       Lower Body Dressing: Maximal assistance;+2 for safety/equipment;Sit to/from stand   Toilet Transfer: Moderate assistance;+2 for safety/equipment;Ambulation (to recliner)   Toileting- Clothing Manipulation and Hygiene: Minimal assistance;Sit to/from stand          General ADL Comments: pt is able to complete UB adls with set up.  Educated on reacher for LB adls--she does not have this at home.  Pt was able to maintain 25% weight bearing     Vision     Perception     Praxis      Pertinent Vitals/Pain Pain Assessment: 0-10 Pain Score: 3  Pain Location: L hip Pain Descriptors / Indicators: Sore Pain Intervention(s): Limited activity within patient's tolerance;Monitored during session;Premedicated before session;Repositioned;Ice applied     Hand Dominance     Extremity/Trunk Assessment Upper Extremity Assessment Upper Extremity Assessment: Overall WFL for tasks assessed           Communication Communication Communication: No difficulties   Cognition Arousal/Alertness: Awake/alert Behavior During Therapy: WFL for tasks assessed/performed Overall Cognitive Status: Within Functional Limits for tasks assessed                     General Comments       Exercises       Shoulder Instructions      Home Living Family/patient expects to be discharged to:: Private residence Living Arrangements: Spouse/significant other  Additional Comments: has multi level home with a lot of stairs:  1/2 bath on main floor. Plans SNF for rehab      Prior Functioning/Environment Level of Independence: Needs assistance             OT Diagnosis: Acute pain   OT Problem List: Decreased strength;Decreased activity tolerance;Decreased knowledge of use of DME or AE;Pain   OT Treatment/Interventions: Self-care/ADL training;DME and/or AE instruction;Patient/family education    OT Goals(Current goals can be found in the care plan section) Acute Rehab OT Goals OT Goal Formulation: With patient Time For Goal Achievement: 01/12/15 Potential to Achieve Goals: Good ADL Goals Pt Will Perform Lower Body Bathing: with supervision;with adaptive equipment;sit to/from stand Pt Will Perform Lower Body  Dressing: with supervision;with adaptive equipment;sit to/from stand Pt Will Transfer to Toilet: with supervision;ambulating;bedside commode Pt Will Perform Toileting - Clothing Manipulation and hygiene: with supervision;sit to/from stand  OT Frequency: Min 2X/week   Barriers to D/C:            Co-evaluation PT/OT/SLP Co-Evaluation/Treatment: Yes Reason for Co-Treatment: For patient/therapist safety PT goals addressed during session: Mobility/safety with mobility OT goals addressed during session: ADL's and self-care      End of Session    Activity Tolerance: Patient tolerated treatment well Patient left: in chair;with call bell/phone within reach   Time: [id number] OT Time Calculation (min): 20 min Charges:  OT General Charges $OT Visit: 1 Procedure OT Evaluation $Initial OT Evaluation Tier I: 1 Procedure G-Codes:    Finnegan Gatta 02-Feb-2015, 12:26 PM Lesle Chris, OTR/L [phone] 2015-02-02

## 2015-01-05 NOTE — Progress Notes (Signed)
Physical Therapy Treatment Patient Details Name: [name] MRN: [id number] DOB: [birth year] Today's Date: January 29, 2015    History of Present Illness s/p L IM nail    PT Comments    Pt motivated but with ltd endurance and PWB slowing progress.  Follow Up Recommendations  SNF     Equipment Recommendations  Rolling walker with 5" wheels    Recommendations for Other Services OT consult     Precautions / Restrictions Precautions Precautions: Fall Restrictions Weight Bearing Restrictions: Yes LLE Weight Bearing: Partial weight bearing LLE Partial Weight Bearing Percentage or Pounds: 25%    Mobility  Bed Mobility Overal bed mobility: Needs Assistance;+2 for physical assistance Bed Mobility: Sit to Supine       Sit to supine: Mod assist;+2 for physical assistance   General bed mobility comments: assist for bilat LEs and control of trunk  Transfers Overall transfer level: Needs assistance Equipment used: Rolling walker (2 wheeled) Transfers: Sit to/from Stand Sit to Stand: Min assist;+2 safety/equipment         General transfer comment: assist to rise and steady.  Cues for UE/LE placement  Ambulation/Gait Ambulation/Gait assistance: Min assist;+2 safety/equipment Ambulation Distance (Feet): 8 Feet Assistive device: Rolling walker (2 wheeled) Gait Pattern/deviations: Step-to pattern;Decreased step length - right;Decreased step length - left;Shuffle;Trunk flexed Gait velocity: decr   General Gait Details: cues for sequence, posture and position from RW.  Pt fatigues easily but good awareness of 25% PWB   Stairs            Wheelchair Mobility    Modified Rankin (Stroke Patients Only)       Balance                                    Cognition Arousal/Alertness: Awake/alert Behavior During Therapy: WFL for tasks assessed/performed Overall Cognitive Status: Within Functional Limits for tasks assessed                       Exercises      General Comments        Pertinent Vitals/Pain Pain Assessment: 0-10 Pain Score: 5  Pain Location: L hip Pain Descriptors / Indicators: Aching;Sore Pain Intervention(s): Limited activity within patient's tolerance;Premedicated before session;Monitored during session;Ice applied    Home Living                      Prior Function            PT Goals (current goals can now be found in the care plan section) Acute Rehab PT Goals Patient Stated Goal: Rehab and home to negotiate many stairs PT Goal Formulation: With patient Time For Goal Achievement: 01/11/15 Potential to Achieve Goals: Fair Progress towards PT goals: Progressing toward goals    Frequency  Min 3X/week    PT Plan Current plan remains appropriate    Co-evaluation             End of Session Equipment Utilized During Treatment: Gait belt Activity Tolerance: Patient tolerated treatment well;Patient limited by fatigue Patient left: in bed;with call bell/phone within reach;with family/visitor present     Time: 1535-1555 PT Time Calculation (min) (ACUTE ONLY): 20 min  Charges:  $Gait Training: 8-22 mins                    G Codes:      Ly Bacchi 01-29-15, 4:04  PM   

## 2015-01-05 NOTE — Progress Notes (Signed)
   Subjective: 1 Day Post-Op Procedure(s) (LRB): INTRAMEDULLARY (IM) NAIL INTERTROCHANTRIC (Left)  Mild pain/soreness but otherwise doing well Plan for therapy today and check on her progress Patient reports pain as mild.  Objective:   VITALS:   Filed Vitals:   01/05/15 0435  BP: 114/60  Pulse: 102  Temp: 98.2 F (36.8 C)  Resp: 20    Left hip incisions healing well nv intact distally No drainage or erythema  LABS  Recent Labs  01/04/15 1430 01/04/15 1441 01/05/15 0443  HGB 12.7 13.6 10.9*  HCT 36.5 40.0 31.6*  WBC 14.9*  --  9.7  PLT 264  --  214     Recent Labs  01/04/15 1430 01/04/15 1441 01/05/15 0443  NA 130* 129* 132*  K 4.2 4.2 4.2  BUN 13 13 9   CREATININE 0.71 0.70 0.61  GLUCOSE 118* 122* 158*     Assessment/Plan: 1 Day Post-Op Procedure(s) (LRB): INTRAMEDULLARY (IM) NAIL INTERTROCHANTRIC (Left) PT/OT Pain control  D/c planning Overall pt looks good    [place], MPAS, PA-C  01/05/2015, 8:15 AM

## 2015-01-05 NOTE — Care Management Note (Addendum)
Case Management Note  Patient Details  Name: [name] MRN: [id number] Date of Birth: 1940-07-27  Subjective/Objective:                   Displaced left intertrochanteric hip fracture  Action/Plan: Discharge planning  Expected Discharge Date:     01/06/15             Expected Discharge Plan:   SNF  In-House Referral:  Clinical Social Work  Discharge planning Services  CM Consult  Post Acute Care Choice:  NA Choice offered to:     DME Arranged:    DME Agency:     HH Arranged:    [address] Agency:     Status of Service:  Completed, signed off  Medicare Important Message Given:    Date Medicare IM Given:    Medicare IM give by:    Date Additional Medicare IM Given:    Additional Medicare Important Message give by:     If discussed at [place] of Stay Meetings, dates discussed:    Additional Comments: CM spoke with patient at the bedside. Patient lives in [place]. Her husband has cancer and is receiving radiation therapy at the [facility]. They have no children. Patient plans to discharge to rehab. She would like to go home but states she knows it will be better on her husband and for her to go to rehab. CSW is aware of placement needs. Apolonio Schneiders, RN 01/05/2015, 10:21 AM

## 2015-01-05 NOTE — Evaluation (Signed)
Physical Therapy Evaluation Patient Details Name: [name] MRN: [id number] DOB: 1941-02-18 Today's Date: 01/05/2015   History of Present Illness  s/p L IM nail  Clinical Impression  Pt s/p L hip fx presents with decreased L LE strength/ROM, post op pain and PWB status limiting functional mobility.  Pt would benefit from follow up rehab to maximize IND and strength prior to dc home.    Follow Up Recommendations SNF    Equipment Recommendations  Rolling walker with 5" wheels    Recommendations for Other Services OT consult     Precautions / Restrictions Precautions Precautions: Fall Restrictions Weight Bearing Restrictions: Yes LLE Weight Bearing: Partial weight bearing LLE Partial Weight Bearing Percentage or Pounds: 25%      Mobility  Bed Mobility Overal bed mobility: Needs Assistance;+2 for physical assistance Bed Mobility: Supine to Sit     Supine to sit: Mod assist;+2 for physical assistance     General bed mobility comments: assist for LLE and trunk; used bedrail  Transfers Overall transfer level: Needs assistance Equipment used: Rolling walker (2 wheeled) Transfers: Sit to/from Stand Sit to Stand: Min assist;+2 safety/equipment;From elevated surface         General transfer comment: assist to rise and steady from high bed.  Cues for UE/LE placement  Ambulation/Gait Ambulation/Gait assistance: Min assist;+2 physical assistance;+2 safety/equipment Ambulation Distance (Feet): 8 Feet Assistive device: Rolling walker (2 wheeled) Gait Pattern/deviations: Step-to pattern;Decreased step length - right;Decreased step length - left;Shuffle;Trunk flexed Gait velocity: decr Gait velocity interpretation: Below normal speed for age/gender General Gait Details: cues for sequence, posture and position from RW.  Pt fatigues easily but good awareness of 25% PWB  Stairs            Wheelchair Mobility    Modified Rankin (Stroke Patients Only)        Balance                                             Pertinent Vitals/Pain Pain Assessment: 0-10 Pain Score: 3  Pain Location: L hip Pain Descriptors / Indicators: Aching;Sore Pain Intervention(s): Limited activity within patient's tolerance;Monitored during session;Premedicated before session;Ice applied    Home Living Family/patient expects to be discharged to:: Skilled nursing facility Living Arrangements: Spouse/significant other               Additional Comments: has multi level home with a lot of stairs:  1/2 bath on main floor. Plans SNF for rehab    Prior Function Level of Independence: Independent               Hand Dominance        Extremity/Trunk Assessment   Upper Extremity Assessment: Overall WFL for tasks assessed           Lower Extremity Assessment: LLE deficits/detail   LLE Deficits / Details: 2+/5 strength at hip with AAROM to 80 flex and 15 abd     Communication   Communication: No difficulties  Cognition Arousal/Alertness: Awake/alert Behavior During Therapy: WFL for tasks assessed/performed Overall Cognitive Status: Within Functional Limits for tasks assessed                      General Comments      Exercises Total Joint Exercises Ankle Circles/Pumps: AROM;Both;15 reps;Supine Quad Sets: AROM;Both;10 reps;Supine Heel Slides: AAROM;15 reps;Supine;Left Hip ABduction/ADduction: AAROM;Left;10 reps;Supine  Assessment/Plan    PT Assessment Patient needs continued PT services  PT Diagnosis Difficulty walking   PT Problem List Decreased strength;Decreased range of motion;Decreased activity tolerance;Decreased balance;Decreased mobility;Decreased knowledge of use of DME;Pain  PT Treatment Interventions DME instruction;Gait training;Functional mobility training;Therapeutic activities;Therapeutic exercise;Patient/family education   PT Goals (Current goals can be found in the Care Plan section)  Acute Rehab PT Goals Patient Stated Goal: Rehab and home to negotiate many stairs PT Goal Formulation: With patient Time For Goal Achievement: 01/11/15 Potential to Achieve Goals: Fair    Frequency Min 3X/week   Barriers to discharge        Co-evaluation PT/OT/SLP Co-Evaluation/Treatment: Yes Reason for Co-Treatment: For patient/therapist safety PT goals addressed during session: Mobility/safety with mobility OT goals addressed during session: ADL's and self-care       End of Session Equipment Utilized During Treatment: Gait belt Activity Tolerance: Patient tolerated treatment well;Patient limited by fatigue Patient left: in chair;with call bell/phone within reach Nurse Communication: Mobility status         Time: 2446-2863 PT Time Calculation (min) (ACUTE ONLY): 34 min   Charges:   PT Evaluation $Initial PT Evaluation Tier I: 1 Procedure     PT G Codes:        Jerusalen Mateja 2015-01-30, 2:53 PM

## 2015-01-05 NOTE — Clinical Social Work Note (Signed)
Clinical Social Work Assessment  Patient Details  Name: GISSELL BARRA MRN: [id number] Date of Birth: December 22, 1940  Date of referral:  01/05/15               Reason for consult:  Facility Placement                Permission sought to share information with:  Facility Art therapist granted to share information::  Yes, Verbal Permission Granted  Name::        Agency::     Relationship::     Contact Information:     Housing/Transportation Living arrangements for the past 2 months:  Single Family Home Source of Information:  Patient Patient Interpreter Needed:    Criminal Activity/Legal Involvement Pertinent to Current Situation/Hospitalization:    Significant Relationships:  Spouse Lives with:  Spouse Do you feel safe going back to the place where you live?  No Need for family participation in patient care:     Care giving concerns:  Pt's husband is going to radiation treatment in [place] and would like SNF in [place] area   Facilities manager / plan:  CSW met with pt and her husband at bedside to discuss SNF options.  CSW presented pt and her husband with lists of SNF and encouraged them to explore thoughts and feelings related to rehab at discharge.  CSW sent pt information to both [facility] and [facility] as pt lives in [place] but husband is doing radiation in [place].  Employment status:    Forensic scientist:  Managed Care PT Recommendations:  [place] / Referral to community resources:     Patient/Family's Response to care:  Pt stated that she lives in [place] but would like [place] as an option for SNF because her husband has 13 more radiation session to go and he will be in [place] often.  Pt has never been to rehab and is not happy about needing to go but with her husband doing radiation and no other supports at home or in the community she feels she must go to rehab at  discharge  Patient/Family's Understanding of and Emotional Response to Diagnosis, Current Treatment, and Prognosis:  Pt appears to understand her diagnoses and needs for rehab.  Pt's mood upbeat and positive with regards to gaining her strengh back with rehab  Emotional Assessment Appearance:  Appears stated age Attitude/Demeanor/Rapport:   (pleasant and cooperative) Affect (typically observed):  Accepting, Calm Orientation:  Oriented to Self, Oriented to Place, Oriented to  Time, Oriented to Situation Alcohol / Substance use:    Psych involvement (Current and /or in the community):     Discharge Needs  Concerns to be addressed:    Readmission within the last 30 days:    Current discharge risk:    Barriers to Discharge:  No Barriers Identified   Carlean Jews, LCSW 01/05/2015, 5:01 PM

## 2015-01-06 ENCOUNTER — Inpatient Hospital Stay (HOSPITAL_COMMUNITY): Payer: Medicare HMO

## 2015-01-06 ENCOUNTER — Encounter (HOSPITAL_COMMUNITY): Payer: Self-pay | Admitting: Orthopedic Surgery

## 2015-01-06 LAB — CBC
HCT: 30.7 % — ABNORMAL LOW (ref 36.0–46.0)
HEMOGLOBIN: 10.1 g/dL — AB (ref 12.0–15.0)
MCH: 31.2 pg (ref 26.0–34.0)
MCHC: 32.9 g/dL (ref 30.0–36.0)
MCV: 94.8 fL (ref 78.0–100.0)
Platelets: 212 10*3/uL (ref 150–400)
RBC: 3.24 MIL/uL — AB (ref 3.87–5.11)
RDW: 12.9 % (ref 11.5–15.5)
WBC: 11.6 10*3/uL — AB (ref 4.0–10.5)

## 2015-01-06 LAB — BASIC METABOLIC PANEL
Anion gap: 5 (ref 5–15)
BUN: 14 mg/dL (ref 6–20)
CHLORIDE: 96 mmol/L — AB (ref 101–111)
CO2: 29 mmol/L (ref 22–32)
CREATININE: 0.42 mg/dL — AB (ref 0.44–1.00)
Calcium: 8.5 mg/dL — ABNORMAL LOW (ref 8.9–10.3)
GFR calc non Af Amer: >60 mL/min (ref >60)
Glucose, Bld: 106 mg/dL — ABNORMAL HIGH (ref 65–99)
POTASSIUM: 3.8 mmol/L (ref 3.5–5.1)
Sodium: 130 mmol/L — ABNORMAL LOW (ref 135–145)

## 2015-01-06 MED ORDER — MAGNESIUM CITRATE PO SOLN: 0.5000 | Freq: Once | ORAL | Status: DC

## 2015-01-06 MED ORDER — IPRATROPIUM-ALBUTEROL 0.5-2.5 (3) MG/3ML IN SOLN
3.0000 mL | Freq: Four times a day (QID) | RESPIRATORY_TRACT | Status: DC
  Administered 2015-01-06: 3 mL via RESPIRATORY_TRACT
  Filled 2015-01-06: qty 3

## 2015-01-06 MED ORDER — FUROSEMIDE 10 MG/ML IJ SOLN
20.0000 mg | Freq: Once | INTRAMUSCULAR | Status: AC
  Administered 2015-01-06: 20 mg via INTRAVENOUS
  Filled 2015-01-06: qty 2

## 2015-01-06 MED ORDER — IPRATROPIUM-ALBUTEROL 0.5-2.5 (3) MG/3ML IN SOLN: 3.0000 mL | RESPIRATORY_TRACT | Status: DC | PRN

## 2015-01-06 MED ORDER — GI COCKTAIL ~~LOC~~
30.0000 mL | Freq: Three times a day (TID) | ORAL | Status: DC | PRN
  Filled 2015-01-06: qty 30

## 2015-01-06 NOTE — Progress Notes (Signed)
Utilization review completed.  

## 2015-01-06 NOTE — Progress Notes (Signed)
Patient Demographics  [name], is a 74 y.o. female, DOB - 1940-11-02, CBS:[id number]  Admit date - 01/04/2015   Admitting Physician Albertine Patricia, MD  Outpatient Primary MD for the patient is Leonides Sake, MD  LOS - 2   Chief Complaint  Patient presents with  . Fall  . Leg Injury  . Hip Injury       Admission HPI/Brief narrative: 74 y.o. female, with past medical history of hypertension, presents with mechanical fall and right hip fracture, status post surgical repair 8/6.  Subjective:   [name] today has, No headache, No chest pain, No abdominal pain - No Nausea, No new weakness tingling or numbness, No Cough - SOB.   Assessment & Plan    Principal Problem:   Hip fracture Active Problems:   Hyponatremia   HTN (hypertension)   Leukocytosis   Hip fracture requiring operative repair  Left hip fracture - Secondary to mechanical fall,  - postsurgical repair 01/04/15 by Dr. Veverly Fells - Will need SNF placement  Hypertension - Blood Pressure is acceptable, continue with home medication  Hyponatremia - Mild, continue to monitor  Leukocytosis - Stress related from hip fracture, resolved  Hypoxia - Patient still requiring oxygen 1-2 L, chest x-ray with no acute findings, encouraged to use incentive spirometer, will have on meds, will give one dose Lasix.  Code Status: full  Family Communication: none at bedside  Disposition Plan: SNF   Procedures  Left hip surgical repair by Dr. Veverly Fells 8/6   Consults   Orthopedic   Medications  Scheduled Meds: . amLODipine  10 mg Oral Daily  . aspirin EC  81 mg Oral Daily  . benazepril  20 mg Oral Daily  . docusate sodium  100 mg Oral BID  . enoxaparin (LOVENOX) injection  40 mg Subcutaneous Q24H  . ferrous sulfate  325 mg Oral TID PC  . magnesium citrate  0.5 Bottle Oral Once  . metoprolol  50 mg Oral Daily   Continuous  Infusions: . sodium chloride 20 mL/hr at 01/05/15 1738   PRN Meds:.acetaminophen **OR** acetaminophen, bisacodyl, gi cocktail, HYDROcodone-acetaminophen, HYDROmorphone (DILAUDID) injection, menthol-cetylpyridinium **OR** phenol, methocarbamol **OR** methocarbamol (ROBAXIN)  IV, metoCLOPramide **OR** metoCLOPramide (REGLAN) injection, ondansetron **OR** ondansetron (ZOFRAN) IV, polyethylene glycol  DVT Prophylaxis  Lovenox -   Lab Results  Component Value Date   PLT 212 01/06/2015    Antibiotics    Anti-infectives    Start     Dose/Rate Route Frequency Ordered Stop   01/05/15 0145  ceFAZolin (ANCEF) IVPB 2 g/50 mL premix     2 g 100 mL/hr over 30 Minutes Intravenous Every 6 hours 01/05/15 0146 01/05/15 0836   01/04/15 1538  ceFAZolin (ANCEF) IVPB 2 g/50 mL premix     2 g 100 mL/hr over 30 Minutes Intravenous 30 min pre-op 01/04/15 1533 01/04/15 1715          Objective:   Filed Vitals:   01/06/15 0510 01/06/15 0511 01/06/15 1033 01/06/15 1352  BP:  124/59  107/57  Pulse:  99 106 88  Temp:  98.2 F (36.8 C)  98.3 F (36.8 C)  TempSrc:  Oral  Oral  Resp:  20  16  Height:  Weight:      SpO2: 87% 92%  95%    Wt Readings from Last 3 Encounters:  01/05/15 68.04 kg (150 lb)  03/02/13 71.215 kg (157 lb)  02/15/13 71.396 kg (157 lb 6.4 oz)     Intake/Output Summary (Last 24 hours) at 01/06/15 1700 Last data filed at 01/06/15 1450  Gross per 24 hour  Intake 1271.5 ml  Output   1300 ml  Net  -28.5 ml     Physical Exam  Awake Alert, Oriented X 3, No new F.N deficits, Normal affect [place].AT,PERRAL Supple Neck,No JVD, No cervical lymphadenopathy appriciated.  Symmetrical Chest wall movement, Good air movement bilaterally, CTAB RRR,No Gallops,Rubs or new Murmurs, No Parasternal Heave +ve B.Sounds, Abd Soft, No tenderness, No organomegaly appriciated, No rebound - guarding or rigidity. No Cyanosis, Clubbing or edema, No new Rash or bruise ,    Data Review    Micro Results No results found for this or any previous visit (from the past 240 hour(s)).  Radiology Reports Dg Chest 1 View  01/04/2015   CLINICAL DATA:  74 year old female with history of trauma from a fall yesterday evening.  EXAM: CHEST  1 VIEW  COMPARISON:  No priors.  FINDINGS: Lung volumes are normal. No consolidative airspace disease. No pleural effusions. No pneumothorax. No evidence of pulmonary edema. Heart size is borderline enlarged. The patient is rotated to the right on today's exam, resulting in distortion of the mediastinal contours and reduced diagnostic sensitivity and specificity for mediastinal pathology. Visualized bony thorax is grossly intact. Old healed fractures of the lateral aspects of the right seventh and eighth ribs.  IMPRESSION: 1. No radiographic evidence of acute cardiopulmonary disease.   Electronically Signed   By: Vinnie Langton M.D.   On: 01/04/2015 14:25   Dg Knee 2 Views Left  01/04/2015   CLINICAL DATA:  Pt from home. Around 1800 last night, pt was sitting at the table. Left foot felt tingly and "asleep." Went to stand up and tripped over chair and fell. C/o left femur pain and lower back pain. Foreshortening of left leg noted, as well as an external rotation. *Best obtainable images due to limited movement of pt.  EXAM: LEFT KNEE - 1-2 VIEW  COMPARISON:  None.  FINDINGS: No fracture. There is mild medial joint space compartment narrowing. No other arthropathic change. Bone to demineralized. No bone lesion.  No joint effusion.  Vascular calcifications are noted posteriorly.  IMPRESSION: No fracture or acute finding.   Electronically Signed   By: Lajean Manes M.D.   On: 01/04/2015 14:27   Ct Head Wo Contrast  01/04/2015   CLINICAL DATA:  74 year old female with history of neck pain.  EXAM: CT HEAD WITHOUT CONTRAST  CT CERVICAL SPINE WITHOUT CONTRAST  TECHNIQUE: Multidetector CT imaging of the head and cervical spine was performed following the standard protocol  without intravenous contrast. Multiplanar CT image reconstructions of the cervical spine were also generated.  COMPARISON:  No priors.  FINDINGS: CT HEAD FINDINGS  Mild cerebral atrophy. Patchy and confluent areas of decreased attenuation are noted throughout the deep and periventricular white matter of the cerebral hemispheres bilaterally, compatible with chronic microvascular ischemic disease. 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 No acute intracranial abnormalities. Specifically, no evidence of acute intracranial hemorrhage, no definite findings of acute/subacute cerebral ischemia, no mass, mass effect, hydrocephalus or abnormal  intra or extra-axial fluid collections. Visualized paranasal sinuses and mastoids are well pneumatized. No acute displaced skull fractures are identified.  CT CERVICAL SPINE FINDINGS  No acute displaced fracture of the cervical spine. Straightening of normal cervical lordosis, favored to be positional. Alignment is otherwise anatomic. Multilevel degenerative disc disease, most severe at C5-C6 and C6-C7. Multilevel facet arthropathy. Prevertebral soft tissues are normal. Visualized portions of the upper thorax are unremarkable.  IMPRESSION: 1. No acute abnormality of the cervical spine to account for the patient's symptoms. 2. 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 3. No acute intracranial abnormalities. 4. Mild cerebral atrophy and chronic microvascular ischemic changes throughout the deep and periventricular white matter of the cerebral hemispheres bilaterally. 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   Electronically Signed   By: Vinnie Langton M.D.   On: 01/04/2015 13:46   Ct Cervical Spine Wo  Contrast  01/04/2015   CLINICAL DATA:  74 year old female with history of neck pain.  EXAM: CT HEAD WITHOUT CONTRAST  CT CERVICAL SPINE WITHOUT CONTRAST  TECHNIQUE: Multidetector CT imaging of the head and cervical spine was performed following the standard protocol without intravenous contrast. Multiplanar CT image reconstructions of the cervical spine were also generated.  COMPARISON:  No priors.  FINDINGS: CT HEAD FINDINGS  Mild cerebral atrophy. Patchy and confluent areas of decreased attenuation are noted throughout the deep and periventricular white matter of the cerebral hemispheres bilaterally, compatible with chronic microvascular ischemic disease. 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 No acute intracranial abnormalities. Specifically, no evidence of acute intracranial hemorrhage, no definite findings of acute/subacute cerebral ischemia, no mass, mass effect, hydrocephalus or abnormal intra or extra-axial fluid collections. Visualized paranasal sinuses and mastoids are well pneumatized. No acute displaced skull fractures are identified.  CT CERVICAL SPINE FINDINGS  No acute displaced fracture of the cervical spine. Straightening of normal cervical lordosis, favored to be positional. Alignment is otherwise anatomic. Multilevel degenerative disc disease, most severe at C5-C6 and C6-C7. Multilevel facet arthropathy. Prevertebral soft tissues are normal. Visualized portions of the upper thorax are unremarkable.  IMPRESSION: 1. No acute abnormality of the cervical spine to account for the patient's symptoms. 2. 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 3. No acute intracranial abnormalities. 4. Mild cerebral atrophy and chronic microvascular ischemic changes throughout the deep and periventricular white matter of the cerebral  hemispheres bilaterally. 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   Electronically Signed   By: Vinnie Langton M.D.   On: 01/04/2015 13:46   Pelvis Portable  01/04/2015   CLINICAL DATA:  Patient status post intra medullary nail proximal left femur.  EXAM: OPERATIVE LEFT HIP (WITH PELVIS IF PERFORMED) to VIEWS  TECHNIQUE: Fluoroscopic spot image(s) were submitted for interpretation post-operatively.  COMPARISON:  Radiograph 01/04/2015  FINDINGS: Patient status post intra medullary rod fixation of previously described left intratrochanteric femur fracture. Soft tissue gas and overlying staple line. Fracture appears in improved anatomic alignment. Lumbar spine degenerative changes. Right hip joint degenerative changes.  IMPRESSION: Patient status post ORIF left femoral neck fracture.   Electronically Signed   By: Polly Cobia.D.  On: 01/04/2015 18:58   Dg Chest Port 1 View  01/06/2015   CLINICAL DATA:  Short of breath  EXAM: PORTABLE CHEST - 1 VIEW  COMPARISON:  01/04/2015  FINDINGS: Heart size upper normal. Negative for heart failure. Lungs are clear without infiltrate or effusion.  IMPRESSION: No active disease.   Electronically Signed   By: Franchot Gallo M.D.   On: 01/06/2015 10:42   Dg Hip Operative Unilat With Pelvis Left  01/04/2015   CLINICAL DATA:  Patient status post intra medullary nail proximal left femur.  EXAM: OPERATIVE LEFT HIP (WITH PELVIS IF PERFORMED) to VIEWS  TECHNIQUE: Fluoroscopic spot image(s) were submitted for interpretation post-operatively.  COMPARISON:  Radiograph 01/04/2015  FINDINGS: Patient status post intra medullary rod fixation of previously described left intratrochanteric femur fracture. Soft tissue gas and overlying staple line. Fracture appears in improved anatomic alignment. Lumbar spine degenerative changes. Right hip joint degenerative changes.  IMPRESSION: Patient status post ORIF  left femoral neck fracture.   Electronically Signed   By: Lovey Newcomer M.D.   On: 01/04/2015 18:58   Dg Hip Unilat With Pelvis 2-3 Views Left  01/04/2015   CLINICAL DATA:  Pt from home. Around 1800 last night, pt was sitting at the table. Left foot felt tingly and "asleep." Went to stand up and tripped over chair and fell. C/o left femur pain and lower back pain. Foreshortening of left leg noted, as well as an external rotation. *Best obtainable images due to limited movement of pt.  EXAM: DG HIP (WITH OR WITHOUT PELVIS) 2-3V LEFT  COMPARISON:  None.  FINDINGS: There is an intertrochanteric fracture of the left proximal femur. Distal fracture component is displaced 19 mm lateral to the proximal fracture component. There is mild varus angulation. There is also apex anterior angulation. No significant comminution.  No other fractures. Bones are diffusely demineralized. Hip joints are normally aligned.  Soft tissues show dense iliac and femoral artery vascular calcifications.  IMPRESSION: Mildly displaced and angulated intertrochanteric fracture of the left proximal femur.   Electronically Signed   By: Lajean Manes M.D.   On: 01/04/2015 14:26   Dg Femur Min 2 Views Left  01/04/2015   CLINICAL DATA:  74 year old female with history of trauma from a fall yesterday complaining of left hip pain.  EXAM: LEFT FEMUR 2 VIEWS  COMPARISON:  No priors.  FINDINGS: Five views of the left femur demonstrate an acute mildly displace and angulated intertrochanteric fracture of the left hip, with approximately 20 degrees of varus angulation. Femoral head appears properly located within the left acetabulum on the frontal projection.  IMPRESSION: 1. Acute minimally displaced mildly angulated intertrochanteric fracture of the left hip, as above.   Electronically Signed   By: Vinnie Langton M.D.   On: 01/04/2015 14:27     CBC  Recent Labs Lab 01/04/15 1430 01/04/15 1441 01/05/15 0443 01/06/15 0452  WBC 14.9*  --  9.7 11.6*   HGB 12.7 13.6 10.9* 10.1*  HCT 36.5 40.0 31.6* 30.7*  PLT 264  --  214 212  MCV 93.1  --  94.6 94.8  MCH 32.4  --  32.6 31.2  MCHC 34.8  --  34.5 32.9  RDW 13.1  --  12.9 12.9  LYMPHSABS 1.6  --   --   --   MONOABS 1.3*  --   --   --   EOSABS 0.0  --   --   --   BASOSABS 0.0  --   --   --  Chemistries   Recent Labs Lab 01/04/15 1430 01/04/15 1441 01/05/15 0443 01/06/15 0452  NA 130* 129* 132* 130*  K 4.2 4.2 4.2 3.8  CL 95* 94* 98* 96*  CO2 26  --  28 29  GLUCOSE 118* 122* 158* 106*  BUN 13 13 9 14   CREATININE 0.71 0.70 0.61 0.42*  CALCIUM 9.1  --  8.3* 8.5*  AST 21  --   --   --   ALT 20  --   --   --   ALKPHOS 71  --   --   --   BILITOT 0.8  --   --   --    ------------------------------------------------------------------------------------------------------------------ estimated creatinine clearance is 56.1 mL/min (by C-G formula based on Cr of 0.42). ------------------------------------------------------------------------------------------------------------------ No results for input(s): HGBA1C in the last 72 hours. ------------------------------------------------------------------------------------------------------------------ No results for input(s): CHOL, HDL, LDLCALC, TRIG, CHOLHDL, LDLDIRECT in the last 72 hours. ------------------------------------------------------------------------------------------------------------------ No results for input(s): TSH, T4TOTAL, T3FREE, THYROIDAB in the last 72 hours.  Invalid input(s): FREET3 ------------------------------------------------------------------------------------------------------------------ No results for input(s): VITAMINB12, FOLATE, FERRITIN, TIBC, IRON, RETICCTPCT in the last 72 hours.  Coagulation profile No results for input(s): INR, PROTIME in the last 168 hours.  No results for input(s): DDIMER in the last 72 hours.  Cardiac Enzymes No results for input(s): CKMB, TROPONINI, MYOGLOBIN in the  last 168 hours.  Invalid input(s): CK ------------------------------------------------------------------------------------------------------------------ Invalid input(s): POCBNP     Time Spent in minutes   25 minutes   Cassidey Barrales M.D on 01/06/2015 at 5:00 PM  Between 7am to 7pm - Pager - [phone]  After 7pm go to [email or web address] - password [facility]  [facility]   Office  [phone]

## 2015-01-06 NOTE — Care Management Important Message (Signed)
Important Message  Patient Details  Name: [name] MRN: [id number] Date of Birth: 10/17/1940   Medicare Important Message Given:  Yes-second notification given    Camillo Flaming 01/06/2015, 1:09 [place] Message  Patient Details  Name: [name] MRN: [id number] Date of Birth: 18-Mar-1941   Medicare Important Message Given:  Yes-second notification given    Camillo Flaming 01/06/2015, 1:09 PM

## 2015-01-06 NOTE — Progress Notes (Signed)
   Subjective: 2 Days Post-Op Procedure(s) (LRB): INTRAMEDULLARY (IM) NAIL INTERTROCHANTRIC (Left)  Pt c/o mild to moderate pain and spasms in the left hip and leg  She was able to walk to the door yesterday Denies any new symptoms or issues Patient reports pain as moderate.  Objective:   VITALS:   Filed Vitals:   01/06/15 0511  BP: 124/59  Pulse: 99  Temp: 98.2 F (36.8 C)  Resp: 20    Left hip incision healing well nv intact distally No rashes or edema  LABS  Recent Labs  01/04/15 1430 01/04/15 1441 01/05/15 0443 01/06/15 0452  HGB 12.7 13.6 10.9* 10.1*  HCT 36.5 40.0 31.6* 30.7*  WBC 14.9*  --  9.7 11.6*  PLT 264  --  214 212     Recent Labs  01/04/15 1441 01/05/15 0443 01/06/15 0452  NA 129* 132* 130*  K 4.2 4.2 3.8  BUN 13 9 14   CREATININE 0.70 0.61 0.42*  GLUCOSE 122* 158* 106*     Assessment/Plan: 2 Days Post-Op Procedure(s) (LRB): INTRAMEDULLARY (IM) NAIL INTERTROCHANTRIC (Left) Continue PT/OT D/c planning - likely SNF Pulmonary toilet Pain management as needed    Merla Riches, MPAS, PA-C  01/06/2015, 10:30 AM

## 2015-01-06 NOTE — Progress Notes (Signed)
CSW assisting with d/c planning. Pt has chosen [facility] for [employer]. SNF has contacted [employer] to request authorization for rehab. CSW will assist with this authorization process, as needed.   Werner Lean LCSW [phone]

## 2015-01-07 DIAGNOSIS — S72002D Fracture of unspecified part of neck of left femur, subsequent encounter for closed fracture with routine healing: Secondary | ICD-10-CM

## 2015-01-07 MED ORDER — DOCUSATE SODIUM 100 MG PO CAPS: 100.0000 mg | ORAL_CAPSULE | Freq: Two times a day (BID) | ORAL | Status: DC

## 2015-01-07 MED ORDER — ONDANSETRON HCL 4 MG PO TABS: 4.0000 mg | ORAL_TABLET | Freq: Four times a day (QID) | ORAL | Status: DC | PRN

## 2015-01-07 MED ORDER — POLYETHYLENE GLYCOL 3350 17 G PO PACK: 17.0000 g | PACK | Freq: Every day | ORAL | Status: DC | PRN

## 2015-01-07 MED ORDER — BISACODYL 10 MG RE SUPP: 10.0000 mg | Freq: Every day | RECTAL | Status: DC | PRN

## 2015-01-07 MED ORDER — METHOCARBAMOL 500 MG PO TABS: 500.0000 mg | ORAL_TABLET | Freq: Four times a day (QID) | ORAL | Status: DC | PRN

## 2015-01-07 NOTE — Progress Notes (Signed)
Physical Therapy Treatment Patient Details Name: GLEE LASHOMB MRN: [id number] DOB: June 05, 1940 Today's Date: 01-11-2015    History of Present Illness s/p L IM nail    PT Comments    Pt progressing, will benefit from SNF level therapies d/t issues regarding accessing home environment;  Follow Up Recommendations  SNF     Equipment Recommendations  Rolling walker with 5" wheels    Recommendations for Other Services       Precautions / Restrictions Precautions Precautions: Fall Restrictions Weight Bearing Restrictions: Yes LLE Weight Bearing: Partial weight bearing LLE Partial Weight Bearing Percentage or Pounds: 25    Mobility  Bed Mobility               General bed mobility comments: pt OOB in chair  Transfers Overall transfer level: Needs assistance Equipment used: Rolling walker (2 wheeled) Transfers: Sit to/from Stand Sit to Stand: Min assist         General transfer comment: assist to rise and steady.  Cues for UE/LE placement  Ambulation/Gait Ambulation/Gait assistance: Min assist;+2 safety/equipment (chair follow) Ambulation Distance (Feet): 28 Feet Assistive device: Rolling walker (2 wheeled) Gait Pattern/deviations: Step-to pattern;Trunk flexed Gait velocity: decr   General Gait Details: cues for sequence, posture and position from RW.  Pt fatigues easily but good awareness of 25% PWB   Stairs            Wheelchair Mobility    Modified Rankin (Stroke Patients Only)       Balance                                    Cognition Arousal/Alertness: Awake/alert Behavior During Therapy: WFL for tasks assessed/performed Overall Cognitive Status: Within Functional Limits for tasks assessed                      Exercises General Exercises - Lower Extremity Ankle Circles/Pumps: AROM;Both;10 reps Quad Sets: AROM;10 reps;Both Heel Slides: AAROM;Left;10 reps Hip ABduction/ADduction: AAROM;Left;10 reps    General  Comments        Pertinent Vitals/Pain Pain Assessment: 0-10 Pain Score: 5  Pain Location: L hip Pain Descriptors / Indicators: Aching Pain Intervention(s): Limited activity within patient's tolerance;Monitored during session;Premedicated before session;Ice applied    Home Living                      Prior Function            PT Goals (current goals can now be found in the care plan section) Acute Rehab PT Goals Patient Stated Goal: Rehab and home to negotiate many stairs PT Goal Formulation: With patient Time For Goal Achievement: 01/11/15 Potential to Achieve Goals: Good Progress towards PT goals: Progressing toward goals    Frequency  Min 3X/week    PT Plan Current plan remains appropriate    Co-evaluation             End of Session Equipment Utilized During Treatment: Gait belt Activity Tolerance: Patient tolerated treatment well;Patient limited by fatigue Patient left: in chair;with call bell/phone within reach;with family/visitor present     Time: 1140-1157 PT Time Calculation (min) (ACUTE ONLY): 17 min  Charges:  $Gait Training: 8-22 mins                    G Codes:      Aariyah Sampey 11-Jan-2015, 12:01 PM

## 2015-01-07 NOTE — Progress Notes (Signed)
   Subjective: 3 Days Post-Op Procedure(s) (LRB): INTRAMEDULLARY (IM) NAIL INTERTROCHANTRIC (Left)  Pt c/o continued mild to moderate pain in the left hip Did not have therapy yesterday Plan for SNF placement once medically stable Denies any new symptoms or issues Patient reports pain as moderate.  Objective:   VITALS:   Filed Vitals:   01/07/15 1000  BP: 117/68  Pulse: 65  Temp:   Resp:     Left hip incisions healing well nv intact distally No rashes or edema  LABS  Recent Labs  01/04/15 1430 01/04/15 1441 01/05/15 0443 01/06/15 0452  HGB 12.7 13.6 10.9* 10.1*  HCT 36.5 40.0 31.6* 30.7*  WBC 14.9*  --  9.7 11.6*  PLT 264  --  214 212     Recent Labs  01/04/15 1441 01/05/15 0443 01/06/15 0452  NA 129* 132* 130*  K 4.2 4.2 3.8  BUN 13 9 14   CREATININE 0.70 0.61 0.42*  GLUCOSE 122* 158* 106*     Assessment/Plan: 3 Days Post-Op Procedure(s) (LRB): INTRAMEDULLARY (IM) NAIL INTERTROCHANTRIC (Left) Remain non weight bearing left lower extremity F/u in 2 weeks with Dr. Veverly Fells dvt prophylaxis    Merla Riches, MPAS, PA-C  01/07/2015, 10:52 AM

## 2015-01-07 NOTE — Discharge Summary (Addendum)
[name], is a 74 y.o. female  DOB 1941-03-15  MRN [id number].  Admission date:  01/04/2015  Admitting Physician  Albertine Patricia, MD  Discharge Date:  01/07/2015   Primary MD  Leonides Sake, MD  Recommendations for primary care physician for things to follow:  -  Check CBC, CMP in 3 days - follow with Dr Veverly Fells in 2 weeks. - Patient is on Lovenox for DVT prophylaxis 30 days postop   Admission Diagnosis  Neck pain [M54.2] Hyponatremia [E87.1] Hip fracture, left, closed, initial encounter [S72.002A]   Discharge Diagnosis  Neck pain [M54.2] Hyponatremia [E87.1] Hip fracture, left, closed, initial encounter [S72.002A]   Principal Problem:   Hip fracture Active Problems:   Hyponatremia   HTN (hypertension)   Leukocytosis   Hip fracture requiring operative repair      Past Medical History  Diagnosis Date  . Hypertension   . Hyperlipidemia     Past Surgical History  Procedure Laterality Date  . Appendectomy  1954  . Intramedullary (im) nail intertrochanteric Left 01/04/2015    Procedure: INTRAMEDULLARY (IM) NAIL INTERTROCHANTRIC;  Surgeon: Netta Cedars, MD;  Location: [facility];  Service: Orthopedics;  Laterality: Left;       History of present illness and  Hospital Course:     Kindly see H&P for history of present illness and admission details, please review complete Labs, Consult reports and Test reports for all details in brief  HPI  from the history and physical done on the day of admission 01/07/15 [name] is a 74 y.o. female, with past medical history of hypertension, presents with mechanical fall and right hip pain, patient reports she tripped , where she had a fall , with significant left hip pain, hip x-ray significant for a fracture, CT head and cervical spine with no acute findings, she denies any dizziness or lightheadedness opreceding the fall, denies any loss of  consciousness ,denies any chest pain, shortness of breath, palpitation, fever, workup significant for mild leukocytosis and hyponatremia, denies any cardiac history, hospitalist called to admit.   Hospital Course   Left hip fracture - Secondary to mechanical fall,  - postsurgical repair 01/04/15 by Dr. Veverly Fells -  SNF placement - Continue with Lovenox for DVT prophylaxis for next 30 days.  Hypertension - Blood Pressure is acceptable, continue with home medication  Hyponatremia - Mild,  Leukocytosis - Stress related from hip fracture, resolved  Hypoxia - Resolved, patient on room air, continue with incentive spirometry        Discharge Condition:  stable   Follow UP  Follow-up Information    Follow up with NORRIS,STEVEN R, MD. Call in 2 weeks.   Specialty:  Orthopedic Surgery   Why:  [phone]   Contact information:   [address] [place] [address] [place] [address] [phone]       Call Leonides Sake, MD.   Specialty:  Family Medicine   Why:  post SNF dischrge   Contact information:   Dr. Cristela Blue [place]rick [address]  [place] [address] [phone]         Discharge Instructions  and  Discharge Medications         Discharge Instructions    Diet - low sodium heart healthy    Complete by:  As directed      Discharge instructions    Complete by:  As directed   Follow with Primary MD Leonides Sake, MD after dischrage from SNF  Get CBC, CMP, 2 view Chest X ray checked  by Primary MD next visit.    Activity: As PT/OT recommendation  Disposition SNF   Diet: Heart Healthy  , with feeding assistance and aspiration precautions.  For Heart failure patients - Check your Weight same time everyday, if you gain over 2 pounds, or you develop in leg swelling, experience more shortness of breath or chest pain, call your Primary MD immediately. Follow Cardiac Low Salt Diet and 1.5 lit/day fluid restriction.   On your next visit with  your primary care physician please Get Medicines reviewed and adjusted.   Please request your Prim.MD to go over all Hospital Tests and Procedure/Radiological results at the follow up, please get all Hospital records sent to your Prim MD by signing hospital release before you go home.   If you experience worsening of your admission symptoms, develop shortness of breath, life threatening emergency, suicidal or homicidal thoughts you must seek medical attention immediately by calling 911 or calling your MD immediately  if symptoms less severe.  You Must read complete instructions/literature along with all the possible adverse reactions/side effects for all the Medicines you take and that have been prescribed to you. Take any new Medicines after you have completely understood and accpet all the possible adverse reactions/side effects.   Do not drive, operating heavy machinery, perform activities at heights, swimming or participation in water activities or provide baby sitting services if your were admitted for syncope or siezures until you have seen by Primary MD or a Neurologist and advised to do so again.  Do not drive when taking Pain medications.    Do not take more than prescribed Pain, Sleep and Anxiety Medications  Special Instructions: If you have smoked or chewed Tobacco  in the last 2 yrs please stop smoking, stop any regular Alcohol  and or any Recreational drug use.  Wear Seat belts while driving.   Please note  You were cared for by a hospitalist during your hospital stay. If you have any questions about your discharge medications or the care you received while you were in the hospital after you are discharged, you can call the unit and asked to speak with the hospitalist on call if the hospitalist that took care of you is not available. Once you are discharged, your primary care physician will handle any further medical issues. Please note that NO REFILLS for any discharge  medications will be authorized once you are discharged, as it is imperative that you return to your primary care physician (or establish a relationship with a primary care physician if you do not have one) for your aftercare needs so that they can reassess your need for medications and monitor your lab values.     Discharge wound care:    Complete by:  As directed   Ice to the left hip,  Partial (25%) body weight on the left LE  Patient will need 30 days of subQ Lovenox for DVT prophylaxis  Keep the incision covered and clean and dry for one  week, then ok to shower and get the wound wet  Follow up with Dr Veverly Fells in two weeks  [phone]     Partial weight bearing    Complete by:  As directed   25% body weight, with supervision and with walker  % Body Weight:  25%  Laterality:  left  Extremity:  Lower            Medication List    TAKE these medications        acetaminophen 500 MG tablet  Commonly known as:  TYLENOL  Take 500 mg by mouth every 6 (six) hours as needed for mild pain or headache.     amLODipine 10 MG tablet  Commonly known as:  NORVASC  Take 10 mg by mouth daily.     aspirin 81 MG tablet  Take 81 mg by mouth daily.     benazepril 20 MG tablet  Commonly known as:  LOTENSIN  Take 20 mg by mouth daily.     betamethasone valerate lotion 0.1 %  Commonly known as:  VALISONE  Apply 1 application topically 2 (two) times daily.     bisacodyl 10 MG suppository  Commonly known as:  DULCOLAX  Place 1 suppository (10 mg total) rectally daily as needed for moderate constipation.     docusate sodium 100 MG capsule  Commonly known as:  COLACE  Take 1 capsule (100 mg total) by mouth 2 (two) times daily.     enoxaparin 40 MG/0.4ML injection  Commonly known as:  LOVENOX  Inject 0.4 mLs (40 mg total) into the skin daily. 30 days post op     HYDROcodone-acetaminophen 5-325 MG per tablet  Commonly known as:  NORCO  Take 1 tablet by mouth every 6 (six) hours as needed  for moderate pain.     methocarbamol 500 MG tablet  Commonly known as:  ROBAXIN  Take 1 tablet (500 mg total) by mouth every 6 (six) hours as needed for muscle spasms.     metoprolol 50 MG tablet  Commonly known as:  LOPRESSOR  Take 50 mg by mouth daily.     ondansetron 4 MG tablet  Commonly known as:  ZOFRAN  Take 1 tablet (4 mg total) by mouth every 6 (six) hours as needed for nausea.     polyethylene glycol packet  Commonly known as:  MIRALAX / GLYCOLAX  Take 17 g by mouth daily as needed for mild constipation.          Diet and Activity recommendation: See Discharge Instructions above   Consults obtained -  orthopedic  Major procedures and Radiology Reports - PLEASE review detailed and final reports for all details, in brief - Left hip surgical repair by Dr. Veverly Fells 8/6     Dg Chest 1 View  01/04/2015   CLINICAL DATA:  75 year old female with history of trauma from a fall yesterday evening.  EXAM: CHEST  1 VIEW  COMPARISON:  No priors.  FINDINGS: Lung volumes are normal. No consolidative airspace disease. No pleural effusions. No pneumothorax. No evidence of pulmonary edema. Heart size is borderline enlarged. The patient is rotated to the right on today's exam, resulting in distortion of the mediastinal contours and reduced diagnostic sensitivity and specificity for mediastinal pathology. Visualized bony thorax is grossly intact. Old healed fractures of the lateral aspects of the right seventh and eighth ribs.  IMPRESSION: 1. No radiographic evidence of acute cardiopulmonary disease.   Electronically Signed   By: Vinnie Langton  M.D.   On: 01/04/2015 14:25   Dg Knee 2 Views Left  01/04/2015   CLINICAL DATA:  Pt from home. Around 1800 last night, pt was sitting at the table. Left foot felt tingly and "asleep." Went to stand up and tripped over chair and fell. C/o left femur pain and lower back pain. Foreshortening of left leg noted, as well as an external rotation. *Best  obtainable images due to limited movement of pt.  EXAM: LEFT KNEE - 1-2 VIEW  COMPARISON:  None.  FINDINGS: No fracture. There is mild medial joint space compartment narrowing. No other arthropathic change. Bone to demineralized. No bone lesion.  No joint effusion.  Vascular calcifications are noted posteriorly.  IMPRESSION: No fracture or acute finding.   Electronically Signed   By: Lajean Manes M.D.   On: 01/04/2015 14:27   Ct Head Wo Contrast  01/04/2015   CLINICAL DATA:  74 year old female with history of neck pain.  EXAM: CT HEAD WITHOUT CONTRAST  CT CERVICAL SPINE WITHOUT CONTRAST  TECHNIQUE: Multidetector CT imaging of the head and cervical spine was performed following the standard protocol without intravenous contrast. Multiplanar CT image reconstructions of the cervical spine were also generated.  COMPARISON:  No priors.  FINDINGS: CT HEAD FINDINGS  Mild cerebral atrophy. Patchy and confluent areas of decreased attenuation are noted throughout the deep and periventricular white matter of the cerebral hemispheres bilaterally, compatible with chronic microvascular ischemic disease. 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 No acute intracranial abnormalities. Specifically, no evidence of acute intracranial hemorrhage, no definite findings of acute/subacute cerebral ischemia, no mass, mass effect, hydrocephalus or abnormal intra or extra-axial fluid collections. Visualized paranasal sinuses and mastoids are well pneumatized. No acute displaced skull fractures are identified.  CT CERVICAL SPINE FINDINGS  No acute displaced fracture of the cervical spine. Straightening of normal cervical lordosis, favored to be positional. Alignment is otherwise anatomic. Multilevel degenerative disc disease, most severe at C5-C6 and C6-C7. Multilevel facet arthropathy. Prevertebral soft  tissues are normal. Visualized portions of the upper thorax are unremarkable.  IMPRESSION: 1. No acute abnormality of the cervical spine to account for the patient's symptoms. 2. 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 3. No acute intracranial abnormalities. 4. Mild cerebral atrophy and chronic microvascular ischemic changes throughout the deep and periventricular white matter of the cerebral hemispheres bilaterally. 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   Electronically Signed   By: Vinnie Langton M.D.   On: 01/04/2015 13:46   Ct Cervical Spine Wo Contrast  01/04/2015   CLINICAL DATA:  74 year old female with history of neck pain.  EXAM: CT HEAD WITHOUT CONTRAST  CT CERVICAL SPINE WITHOUT CONTRAST  TECHNIQUE: Multidetector CT imaging of the head and cervical spine was performed following the standard protocol without intravenous contrast. Multiplanar CT image reconstructions of the cervical spine were also generated.  COMPARISON:  No priors.  FINDINGS: CT HEAD FINDINGS  Mild cerebral atrophy. Patchy and confluent areas of decreased attenuation are noted throughout the deep and periventricular white matter of the cerebral hemispheres bilaterally, compatible with chronic microvascular ischemic disease. Several foci of well-defined low-attenuation in the head of the left caudate nucleus and in the medial aspect of the left temporal lobe, compatible with old  lacunar infarctions. No acute intracranial abnormalities. Specifically, no evidence of acute intracranial hemorrhage, no definite findings of acute/subacute cerebral ischemia, no mass, mass effect, hydrocephalus or abnormal intra or extra-axial fluid collections. Visualized paranasal sinuses and mastoids are well pneumatized. No acute displaced skull fractures are identified.  CT CERVICAL SPINE FINDINGS  No acute displaced  fracture of the cervical spine. Straightening of normal cervical lordosis, favored to be positional. Alignment is otherwise anatomic. Multilevel degenerative disc disease, most severe at C5-C6 and C6-C7. Multilevel facet arthropathy. Prevertebral soft tissues are normal. Visualized portions of the upper thorax are unremarkable.  IMPRESSION: 1. No acute abnormality of the cervical spine to account for the patient's symptoms. 2. 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 3. No acute intracranial abnormalities. 4. Mild cerebral atrophy and chronic microvascular ischemic changes throughout the deep and periventricular white matter of the cerebral hemispheres bilaterally. 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   Electronically Signed   By: Vinnie Langton M.D.   On: 01/04/2015 13:46   Pelvis Portable  01/04/2015   CLINICAL DATA:  Patient status post intra medullary nail proximal left femur.  EXAM: OPERATIVE LEFT HIP (WITH PELVIS IF PERFORMED) to VIEWS  TECHNIQUE: Fluoroscopic spot image(s) were submitted for interpretation post-operatively.  COMPARISON:  Radiograph 01/04/2015  FINDINGS: Patient status post intra medullary rod fixation of previously described left intratrochanteric femur fracture. Soft tissue gas and overlying staple line. Fracture appears in improved anatomic alignment. Lumbar spine degenerative changes. Right hip joint degenerative changes.  IMPRESSION: Patient status post ORIF left femoral neck fracture.   Electronically Signed   By: Lovey Newcomer M.D.   On: 01/04/2015 18:58   Dg Chest Port 1 View  01/06/2015   CLINICAL DATA:  Short of breath  EXAM: PORTABLE CHEST - 1 VIEW  COMPARISON:  01/04/2015  FINDINGS: Heart size upper normal. Negative for heart failure. Lungs are clear without infiltrate or effusion.  IMPRESSION: No active disease.   Electronically Signed    By: Franchot Gallo M.D.   On: 01/06/2015 10:42   Dg Hip Operative Unilat With Pelvis Left  01/04/2015   CLINICAL DATA:  Patient status post intra medullary nail proximal left femur.  EXAM: OPERATIVE LEFT HIP (WITH PELVIS IF PERFORMED) to VIEWS  TECHNIQUE: Fluoroscopic spot image(s) were submitted for interpretation post-operatively.  COMPARISON:  Radiograph 01/04/2015  FINDINGS: Patient status post intra medullary rod fixation of previously described left intratrochanteric femur fracture. Soft tissue gas and overlying staple line. Fracture appears in improved anatomic alignment. Lumbar spine degenerative changes. Right hip joint degenerative changes.  IMPRESSION: Patient status post ORIF left femoral neck fracture.   Electronically Signed   By: Lovey Newcomer M.D.   On: 01/04/2015 18:58   Dg Hip Unilat With Pelvis 2-3 Views Left  01/04/2015   CLINICAL DATA:  Pt from home. Around 1800 last night, pt was sitting at the table. Left foot felt tingly and "asleep." Went to stand up and tripped over chair and fell. C/o left femur pain and lower back pain. Foreshortening of left leg noted, as well as an external rotation. *Best obtainable images due to limited movement of pt.  EXAM: DG HIP (WITH OR WITHOUT PELVIS) 2-3V LEFT  COMPARISON:  None.  FINDINGS: There is an intertrochanteric fracture of the left proximal femur. Distal fracture component is displaced 19 mm lateral to the proximal fracture component.  There is mild varus angulation. There is also apex anterior angulation. No significant comminution.  No other fractures. Bones are diffusely demineralized. Hip joints are normally aligned.  Soft tissues show dense iliac and femoral artery vascular calcifications.  IMPRESSION: Mildly displaced and angulated intertrochanteric fracture of the left proximal femur.   Electronically Signed   By: Lajean Manes M.D.   On: 01/04/2015 14:26   Dg Femur Min 2 Views Left  01/04/2015   CLINICAL DATA:  74 year old female with  history of trauma from a fall yesterday complaining of left hip pain.  EXAM: LEFT FEMUR 2 VIEWS  COMPARISON:  No priors.  FINDINGS: Five views of the left femur demonstrate an acute mildly displace and angulated intertrochanteric fracture of the left hip, with approximately 20 degrees of varus angulation. Femoral head appears properly located within the left acetabulum on the frontal projection.  IMPRESSION: 1. Acute minimally displaced mildly angulated intertrochanteric fracture of the left hip, as above.   Electronically Signed   By: Vinnie Langton M.D.   On: 01/04/2015 14:27    Micro Results     No results found for this or any previous visit (from the past 240 hour(s)).     Today   Subjective:   [name] today has no headache,no chest abdominal pain,no new weakness tingling or numbness, feels much better wants to go home today.  Objective:   Blood pressure 117/68, pulse 65, temperature 98.4 F (36.9 C), temperature source Oral, resp. rate 16, height 5\' 1"  (1.549 m), weight 68.04 kg (150 lb), SpO2 96 %.   Intake/Output Summary (Last 24 hours) at 01/07/15 1230 Last data filed at 01/07/15 0900  Gross per 24 hour  Intake    720 ml  Output   1375 ml  Net   -655 ml    Exam Awake Alert, Oriented x 3, No new F.N deficits, Normal affect [place].AT,PERRAL Supple Neck,No JVD, No cervical lymphadenopathy appriciated.  Symmetrical Chest wall movement, Good air movement bilaterally,  RRR,No Gallops,Rubs or new Murmurs, No Parasternal Heave +ve B.Sounds, Abd Soft, Non tender No rebound -guarding or rigidity. No Cyanosis, Clubbing or edema, good pulses bilaterally.  Data Review   CBC w Diff:  Lab Results  Component Value Date   WBC 11.6* 01/06/2015   HGB 10.1* 01/06/2015   HCT 30.7* 01/06/2015   PLT 212 01/06/2015   LYMPHOPCT 11* 01/04/2015   MONOPCT 9 01/04/2015   EOSPCT 0 01/04/2015   BASOPCT 0 01/04/2015    CMP:  Lab Results  Component Value Date   NA 130* 01/06/2015     K 3.8 01/06/2015   CL 96* 01/06/2015   CO2 29 01/06/2015   BUN 14 01/06/2015   CREATININE 0.42* 01/06/2015   PROT 7.2 01/04/2015   ALBUMIN 4.0 01/04/2015   BILITOT 0.8 01/04/2015   ALKPHOS 71 01/04/2015   AST 21 01/04/2015   ALT 20 01/04/2015  .   Total Time in preparing paper work, data evaluation and todays exam - 35 minutes  [name] M.D on 01/07/2015 at 12:30 PM  [facility]   Office  [phone]

## 2015-01-07 NOTE — Progress Notes (Signed)
CSW assisting with d/c planning. [facility] is working with [employer] for [employer]. CSW will send today's PT notes to SNF once available. Pt / spouse have been updated.  Werner Lean LCSW [phone]

## 2015-01-07 NOTE — Anesthesia Postprocedure Evaluation (Signed)
  Anesthesia Post-op Note  Patient: [name]  Procedure(s) Performed: Procedure(s): INTRAMEDULLARY (IM) NAIL INTERTROCHANTRIC (Left)  Patient Location: PACU  Anesthesia Type:General  Level of Consciousness: awake and alert   Airway and Oxygen Therapy: Patient Spontanous Breathing  Post-op Pain: Controlled  Post-op Assessment: Post-op Vital signs reviewed, Patient's Cardiovascular Status Stable and Respiratory Function Stable  Post-op Vital Signs: Reviewed  Filed Vitals:   01/07/15 1000  BP: 117/68  Pulse: 65  Temp:   Resp:     Complications: No apparent anesthesia complications

## 2015-01-07 NOTE — Clinical Social Work Placement (Deleted)
   CLINICAL SOCIAL WORK PLACEMENT  NOTE  Date:  01/07/2015  Patient Details  Name: [name] MRN: [id number] Date of Birth: Mar 20, 1941  Clinical Social Work is seeking post-discharge placement for this patient at the [name] level of care (*CSW will initial, date and re-position this form in  chart as items are completed):  Yes   Patient/family provided with [name]'s list of facilities offering this level of care within the geographic area requested by the patient (or if unable, by the patient's family).  Yes   Patient/family informed of their freedom to choose among providers that offer the needed level of care, that participate in Medicare, Medicaid or managed care program needed by the patient, have an available bed and are willing to accept the patient.  Yes   Patient/family informed of [facility]'s ownership interest in [name] and [name], as well as of the fact that they are under no obligation to receive care at these facilities.  PASRR submitted to [name] on 01/05/15     PASRR number received on 01/05/15     Existing PASRR number confirmed on       FL2 transmitted to all facilities in geographic area requested by pt/family on 01/05/15     FL2 transmitted to all facilities within larger geographic area on 01/05/15     Patient informed that his/her managed care company has contracts with or will negotiate with certain facilities, including the following:        Yes   Patient/family informed of bed offers received.  Patient chooses bed at [name]     Physician recommends and patient chooses bed at      Patient to be transferred to [name] on 01/07/15.  Patient to be transferred to facility by [facility]     Patient family notified on 01/07/15 of transfer.  Name of family member notified:  Spouse     PHYSICIAN       Additional Comment: Pt / spouse are in agreement with d/c to [name] today. [facility] transport is required. Pt / spouse are aware that out of pocket costs may be  associated with [facility] transport. NSG reviewed d/c summary,scripts,avs. Scripts included in d/c packet.   _______________________________________________ Luretha Rued, LCSW 01/07/2015, 2:49 PM

## 2015-01-07 NOTE — Clinical Social Work Placement (Signed)
   CLINICAL SOCIAL WORK PLACEMENT  NOTE  Date:  01/07/2015  Patient Details  Name: [name] MRN: [id number] Date of Birth: July 29, 1940  Clinical Social Work is seeking post-discharge placement for this patient at the [place] level of care (*CSW will initial, date and re-position this form in  chart as items are completed):  Yes   Patient/family provided with [facility] Work Department's list of facilities offering this level of care within the geographic area requested by the patient (or if unable, by the patient's family).  Yes   Patient/family informed of their freedom to choose among providers that offer the needed level of care, that participate in Medicare, Medicaid or managed care program needed by the patient, have an available bed and are willing to accept the patient.  Yes   Patient/family informed of [facility]'s ownership interest in [facility] and [facility], as well as of the fact that they are under no obligation to receive care at these facilities.  PASRR submitted to [name] on 01/05/15     PASRR number received on 01/05/15     Existing PASRR number confirmed on       FL2 transmitted to all facilities in geographic area requested by pt/family on 01/05/15     FL2 transmitted to all facilities within larger geographic area on 01/05/15     Patient informed that his/her managed care company has contracts with or will negotiate with certain facilities, including the following:        Yes   Patient/family informed of bed offers received.  Patient chooses bed at [facility]     Physician recommends and patient chooses bed at      Patient to be transferred to [facility] on 01/07/15.  Patient to be transferred to facility by [facility]     Patient family notified on 01/07/15 of transfer.  Name of family member notified:  Spouse     PHYSICIAN       Additional Comment: Pt / spouse are in agreement with d/c to [facility]  today. [employer] has provided prior authorization for SNF placement. [facility] transport is required. Pt / spouse are aware out of pocket costs may be associated with [facility] transport. NSG reviewed d/c summary, scripts, avs. Scripts included in d/c packet.   _______________________________________________ Luretha Rued, [place] 01/07/2015, 2:53 PM

## 2015-01-08 ENCOUNTER — Encounter: Payer: Self-pay | Admitting: Adult Health

## 2015-01-08 ENCOUNTER — Non-Acute Institutional Stay (SKILLED_NURSING_FACILITY): Payer: Medicare HMO | Admitting: Adult Health

## 2015-01-08 DIAGNOSIS — E871 Hypo-osmolality and hyponatremia: Secondary | ICD-10-CM | POA: Diagnosis not present

## 2015-01-08 DIAGNOSIS — S72002S Fracture of unspecified part of neck of left femur, sequela: Secondary | ICD-10-CM

## 2015-01-08 DIAGNOSIS — D72829 Elevated white blood cell count, unspecified: Secondary | ICD-10-CM | POA: Diagnosis not present

## 2015-01-08 DIAGNOSIS — D62 Acute posthemorrhagic anemia: Secondary | ICD-10-CM

## 2015-01-08 DIAGNOSIS — K5900 Constipation, unspecified: Secondary | ICD-10-CM

## 2015-01-08 DIAGNOSIS — I10 Essential (primary) hypertension: Secondary | ICD-10-CM

## 2015-01-09 DIAGNOSIS — D62 Acute posthemorrhagic anemia: Secondary | ICD-10-CM | POA: Insufficient documentation

## 2015-01-09 NOTE — Progress Notes (Signed)
Patient ID: [name], female   DOB: [birth year], 74 y.o.   MRN: [id number]    DATE:  01/08/15 MRN:  [id number]  BIRTHDAY: 07/27/1940  Facility:  Nursing Home Location:  [place] Room Number: 807-P  LEVEL OF CARE:  SNF (31)  Contact Information    Name Relation Home Work [place]  [phone]         Chief Complaint  Patient presents with  . Hospitalization Follow-up    Left hip fracture S/P intramedullary nail, hypertension, hyponatremia, constipation, anemia and leukocytosis    HISTORY OF PRESENT ILLNESS:  This is a 74 year old female who has been admitted to [facility] on 01/07/15. She has PMH of hypertension and hyperlipidemia. She had a fall @ home and sustained a left hip fracture and had intramedullary nail intertrochatric 8/16. She has been admiited for a short-term rehabilitation.  PAST MEDICAL HISTORY:  Past Medical History  Diagnosis Date  . Hypertension   . Hyperlipidemia     CURRENT MEDICATIONS: Reviewed  Patient's Medications  New Prescriptions   No medications on file  Previous Medications   ACETAMINOPHEN (TYLENOL) 500 MG TABLET    Take 500 mg by mouth every 6 (six) hours as needed for mild pain or headache.   AMLODIPINE (NORVASC) 10 MG TABLET    Take 10 mg by mouth daily.   ASPIRIN 81 MG TABLET    Take 81 mg by mouth daily.   BENAZEPRIL (LOTENSIN) 20 MG TABLET    Take 20 mg by mouth daily.   BETAMETHASONE VALERATE LOTION (VALISONE) 0.1 %    Apply 1 application topically 2 (two) times daily.   BISACODYL (DULCOLAX) 10 MG SUPPOSITORY    Place 1 suppository (10 mg total) rectally daily as needed for moderate constipation.   DOCUSATE SODIUM (COLACE) 100 MG CAPSULE    Take 1 capsule (100 mg total) by mouth 2 (two) times daily.   ENOXAPARIN (LOVENOX) 40 MG/0.4ML INJECTION    Inject 0.4 mLs (40 mg total) into the skin daily. 30 days post op   HYDROCODONE-ACETAMINOPHEN (NORCO) 5-325 MG PER TABLET    Take 1 tablet by  mouth every 6 (six) hours as needed for moderate pain.   METHOCARBAMOL (ROBAXIN) 500 MG TABLET    Take 1 tablet (500 mg total) by mouth every 6 (six) hours as needed for muscle spasms.   METOPROLOL (LOPRESSOR) 50 MG TABLET    Take 50 mg by mouth daily.   ONDANSETRON (ZOFRAN) 4 MG TABLET    Take 1 tablet (4 mg total) by mouth every 6 (six) hours as needed for nausea.   POLYETHYLENE GLYCOL (MIRALAX / GLYCOLAX) PACKET    Take 17 g by mouth daily as needed for mild constipation.  Modified Medications   No medications on file  Discontinued Medications   No medications on file     Allergies  Allergen Reactions  . Codeine Hives    REVIEW OF SYSTEMS:  GENERAL: no change in appetite, no fatigue, no weight changes, no fever, chills or weakness EYES: Denies change in vision, dry eyes, eye pain, itching or discharge EARS: Denies change in hearing, ringing in ears, or earache NOSE: Denies nasal congestion or epistaxis MOUTH and THROAT: Denies oral discomfort, gingival pain or bleeding, pain from teeth or hoarseness   RESPIRATORY: no cough, SOB, DOE, wheezing, hemoptysis CARDIAC: no chest pain, edema or palpitations GI: no abdominal pain, diarrhea, constipation, heart burn, nausea or vomiting GU: Denies  dysuria, frequency, hematuria, incontinence, or discharge PSYCHIATRIC: Denies feeling of depression or anxiety. No report of hallucinations, insomnia, paranoia, or agitation   PHYSICAL EXAMINATION  GENERAL APPEARANCE: Well nourished. In no acute distress. Normal body habitus SKIN:  Left hip surgical site has staples and dry dressing, no redness HEAD: Normal in size and contour. No evidence of trauma EYES: Lids open and close normally. No blepharitis, entropion or ectropion. PERRL. Conjunctivae are clear and sclerae are white. Lenses are without opacity EARS: Pinnae are normal. Patient hears normal voice tunes of the examiner MOUTH and THROAT: Lips are without lesions. Oral mucosa is moist and  without lesions. Tongue is normal in shape, size, and color and without lesions NECK: supple, trachea midline, no neck masses, no thyroid tenderness, no thyromegaly LYMPHATICS: no LAN in the neck, no supraclavicular LAN RESPIRATORY: breathing is even & unlabored, BS CTAB CARDIAC: RRR, no murmur,no extra heart sounds, no edema GI: abdomen soft, normal BS, no masses, no tenderness, no hepatomegaly, no splenomegaly EXTREMITIES:  Able to move X 4 extremities PSYCHIATRIC: Alert and oriented X 3. Affect and behavior are appropriate  LABS/RADIOLOGY: Labs reviewed: Basic Metabolic Panel:  Recent Labs  01/04/15 1430 01/04/15 1441 01/05/15 0443 01/06/15 0452  NA 130* 129* 132* 130*  K 4.2 4.2 4.2 3.8  CL 95* 94* 98* 96*  CO2 26  --  28 29  GLUCOSE 118* 122* 158* 106*  BUN 13 13 9 14   CREATININE 0.71 0.70 0.61 0.42*  CALCIUM 9.1  --  8.3* 8.5*   Liver Function Tests:  Recent Labs  01/04/15 1430  AST 21  ALT 20  ALKPHOS 71  BILITOT 0.8  PROT 7.2  ALBUMIN 4.0   CBC:  Recent Labs  01/04/15 1430 01/04/15 1441 01/05/15 0443 01/06/15 0452  WBC 14.9*  --  9.7 11.6*  NEUTROABS 11.9*  --   --   --   HGB 12.7 13.6 10.9* 10.1*  HCT 36.5 40.0 31.6* 30.7*  MCV 93.1  --  94.6 94.8  PLT 264  --  214 212    Dg Chest 1 View  01/04/2015   CLINICAL DATA:  74 year old female with history of trauma from a fall yesterday evening.  EXAM: CHEST  1 VIEW  COMPARISON:  No priors.  FINDINGS: Lung volumes are normal. No consolidative airspace disease. No pleural effusions. No pneumothorax. No evidence of pulmonary edema. Heart size is borderline enlarged. The patient is rotated to the right on today's exam, resulting in distortion of the mediastinal contours and reduced diagnostic sensitivity and specificity for mediastinal pathology. Visualized bony thorax is grossly intact. Old healed fractures of the lateral aspects of the right seventh and eighth ribs.  IMPRESSION: 1. No radiographic evidence of  acute cardiopulmonary disease.   Electronically Signed   By: Vinnie Langton M.D.   On: 01/04/2015 14:25   Dg Knee 2 Views Left  01/04/2015   CLINICAL DATA:  Pt from home. Around 1800 last night, pt was sitting at the table. Left foot felt tingly and "asleep." Went to stand up and tripped over chair and fell. C/o left femur pain and lower back pain. Foreshortening of left leg noted, as well as an external rotation. *Best obtainable images due to limited movement of pt.  EXAM: LEFT KNEE - 1-2 VIEW  COMPARISON:  None.  FINDINGS: No fracture. There is mild medial joint space compartment narrowing. No other arthropathic change. Bone to demineralized. No bone lesion.  No joint effusion.  Vascular calcifications are  noted posteriorly.  IMPRESSION: No fracture or acute finding.   Electronically Signed   By: Lajean Manes M.D.   On: 01/04/2015 14:27   Ct Head Wo Contrast  01/04/2015   CLINICAL DATA:  74 year old female with history of neck pain.  EXAM: CT HEAD WITHOUT CONTRAST  CT CERVICAL SPINE WITHOUT CONTRAST  TECHNIQUE: Multidetector CT imaging of the head and cervical spine was performed following the standard protocol without intravenous contrast. Multiplanar CT image reconstructions of the cervical spine were also generated.  COMPARISON:  No priors.  FINDINGS: CT HEAD FINDINGS  Mild cerebral atrophy. Patchy and confluent areas of decreased attenuation are noted throughout the deep and periventricular white matter of the cerebral hemispheres bilaterally, compatible with chronic microvascular ischemic disease. 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 No acute intracranial abnormalities. Specifically, no evidence of acute intracranial hemorrhage, no definite findings of acute/subacute cerebral ischemia, no mass, mass effect, hydrocephalus or abnormal intra or extra-axial fluid  collections. Visualized paranasal sinuses and mastoids are well pneumatized. No acute displaced skull fractures are identified.  CT CERVICAL SPINE FINDINGS  No acute displaced fracture of the cervical spine. Straightening of normal cervical lordosis, favored to be positional. Alignment is otherwise anatomic. Multilevel degenerative disc disease, most severe at C5-C6 and C6-C7. Multilevel facet arthropathy. Prevertebral soft tissues are normal. Visualized portions of the upper thorax are unremarkable.  IMPRESSION: 1. No acute abnormality of the cervical spine to account for the patient's symptoms. 2. 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 3. No acute intracranial abnormalities. 4. Mild cerebral atrophy and chronic microvascular ischemic changes throughout the deep and periventricular white matter of the cerebral hemispheres bilaterally. 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   Electronically Signed   By: Vinnie Langton M.D.   On: 01/04/2015 13:46   Ct Cervical Spine Wo Contrast  01/04/2015   CLINICAL DATA:  74 year old female with history of neck pain.  EXAM: CT HEAD WITHOUT CONTRAST  CT CERVICAL SPINE WITHOUT CONTRAST  TECHNIQUE: Multidetector CT imaging of the head and cervical spine was performed following the standard protocol without intravenous contrast. Multiplanar CT image reconstructions of the cervical spine were also generated.  COMPARISON:  No priors.  FINDINGS: CT HEAD FINDINGS  Mild cerebral atrophy. Patchy and confluent areas of decreased attenuation are noted throughout the deep and periventricular white matter of the cerebral hemispheres bilaterally, compatible with chronic microvascular ischemic disease. Several foci of well-defined low-attenuation in the head of the left caudate nucleus and in the medial aspect of the left temporal lobe, compatible with old  lacunar infarctions. No acute intracranial abnormalities. Specifically, no evidence of acute intracranial hemorrhage, no definite findings of acute/subacute cerebral ischemia, no mass, mass effect, hydrocephalus or abnormal intra or extra-axial fluid collections. Visualized paranasal sinuses and mastoids are well pneumatized. No acute displaced skull fractures are identified.  CT CERVICAL SPINE FINDINGS  No acute displaced fracture of the cervical spine. Straightening of normal cervical lordosis, favored to be positional. Alignment is otherwise anatomic. Multilevel degenerative disc disease, most severe at C5-C6 and C6-C7. Multilevel facet arthropathy. Prevertebral soft tissues are normal. Visualized portions of the upper thorax are unremarkable.  IMPRESSION: 1. No acute abnormality of the cervical spine to account for the patient's symptoms. 2. There  is severe multilevel degenerative disc disease and cervical spondylosis, as above. 3. No acute intracranial abnormalities. 4. Mild cerebral atrophy and chronic microvascular ischemic changes throughout the deep and periventricular white matter of the cerebral hemispheres bilaterally. 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   Electronically Signed   By: Vinnie Langton M.D.   On: 01/04/2015 13:46   Pelvis Portable  01/04/2015   CLINICAL DATA:  Patient status post intra medullary nail proximal left femur.  EXAM: OPERATIVE LEFT HIP (WITH PELVIS IF PERFORMED) to VIEWS  TECHNIQUE: Fluoroscopic spot image(s) were submitted for interpretation post-operatively.  COMPARISON:  Radiograph 01/04/2015  FINDINGS: Patient status post intra medullary rod fixation of previously described left intratrochanteric femur fracture. Soft tissue gas and overlying staple line. Fracture appears in improved anatomic alignment. Lumbar spine degenerative changes. Right hip joint degenerative changes.   IMPRESSION: Patient status post ORIF left femoral neck fracture.   Electronically Signed   By: Lovey Newcomer M.D.   On: 01/04/2015 18:58   Dg Chest Port 1 View  01/06/2015   CLINICAL DATA:  Short of breath  EXAM: PORTABLE CHEST - 1 VIEW  COMPARISON:  01/04/2015  FINDINGS: Heart size upper normal. Negative for heart failure. Lungs are clear without infiltrate or effusion.  IMPRESSION: No active disease.   Electronically Signed   By: Franchot Gallo M.D.   On: 01/06/2015 10:42   Dg Hip Operative Unilat With Pelvis Left  01/04/2015   CLINICAL DATA:  Patient status post intra medullary nail proximal left femur.  EXAM: OPERATIVE LEFT HIP (WITH PELVIS IF PERFORMED) to VIEWS  TECHNIQUE: Fluoroscopic spot image(s) were submitted for interpretation post-operatively.  COMPARISON:  Radiograph 01/04/2015  FINDINGS: Patient status post intra medullary rod fixation of previously described left intratrochanteric femur fracture. Soft tissue gas and overlying staple line. Fracture appears in improved anatomic alignment. Lumbar spine degenerative changes. Right hip joint degenerative changes.  IMPRESSION: Patient status post ORIF left femoral neck fracture.   Electronically Signed   By: Lovey Newcomer M.D.   On: 01/04/2015 18:58   Dg Hip Unilat With Pelvis 2-3 Views Left  01/04/2015   CLINICAL DATA:  Pt from home. Around 1800 last night, pt was sitting at the table. Left foot felt tingly and "asleep." Went to stand up and tripped over chair and fell. C/o left femur pain and lower back pain. Foreshortening of left leg noted, as well as an external rotation. *Best obtainable images due to limited movement of pt.  EXAM: DG HIP (WITH OR WITHOUT PELVIS) 2-3V LEFT  COMPARISON:  None.  FINDINGS: There is an intertrochanteric fracture of the left proximal femur. Distal fracture component is displaced 19 mm lateral to the proximal fracture component. There is mild varus angulation. There is also apex anterior angulation. No significant  comminution.  No other fractures. Bones are diffusely demineralized. Hip joints are normally aligned.  Soft tissues show dense iliac and femoral artery vascular calcifications.  IMPRESSION: Mildly displaced and angulated intertrochanteric fracture of the left proximal femur.   Electronically Signed   By: Lajean Manes M.D.   On: 01/04/2015 14:26   Dg Femur Min 2 Views Left  01/04/2015   CLINICAL DATA:  74 year old female with history of trauma from a fall yesterday complaining of left hip pain.  EXAM: LEFT FEMUR 2 VIEWS  COMPARISON:  No priors.  FINDINGS: Five views of the left femur demonstrate an  acute mildly displace and angulated intertrochanteric fracture of the left hip, with approximately 20 degrees of varus angulation. Femoral head appears properly located within the left acetabulum on the frontal projection.  IMPRESSION: 1. Acute minimally displaced mildly angulated intertrochanteric fracture of the left hip, as above.   Electronically Signed   By: Vinnie Langton M.D.   On: 01/04/2015 14:27    ASSESSMENT/PLAN:  Left hip fracture S/P IM nail - for rehabilitation; follow-up with Dr. Veverly Fells, orthopedic surgery, in 2 weeks; continue Lovenox 40 mg SQ daily for DVT prophylaxis; Norco 5/325 mg 1 tab PO Q 6 hours PRN for pain; and Robaxin 500 mg 1 tab PO Q 6 hours PRN for muscle spasm  Hypertension - continue Metoprolol 50 mg 1 tab PO Q D and Amlodipine 10 mg 1 tab PO Q D  Hyponatremia - NA 130; check BMP  Constipation - continue Colace 100 mg 1 capsule PO BID and Miralax 17 gm PO daly PRN  Anemia, acute blood loss - hgb 10.1; check CBC  Leukocytosis - wbc 11.6; no signs of infection; most probably stress related; monitor    Goals of care:  Short-term rehabilitation     [facility], [facility] [phone]

## 2015-01-10 ENCOUNTER — Non-Acute Institutional Stay (SKILLED_NURSING_FACILITY): Payer: Medicare HMO | Admitting: Internal Medicine

## 2015-01-10 DIAGNOSIS — I10 Essential (primary) hypertension: Secondary | ICD-10-CM

## 2015-01-10 DIAGNOSIS — D72829 Elevated white blood cell count, unspecified: Secondary | ICD-10-CM

## 2015-01-10 DIAGNOSIS — K5901 Slow transit constipation: Secondary | ICD-10-CM | POA: Diagnosis not present

## 2015-01-10 DIAGNOSIS — D62 Acute posthemorrhagic anemia: Secondary | ICD-10-CM | POA: Diagnosis not present

## 2015-01-10 DIAGNOSIS — S72002D Fracture of unspecified part of neck of left femur, subsequent encounter for closed fracture with routine healing: Secondary | ICD-10-CM | POA: Diagnosis not present

## 2015-01-10 NOTE — Progress Notes (Signed)
Patient ID: [name], female   DOB: Nov 18, 1940, 74 y.o.   MRN: [id number]     [facility]   PCP: [facility], MD  Code Status: full code  Allergies  Allergen Reactions  . Codeine Hives    Chief Complaint  Patient presents with  . New Admit To SNF     HPI:  74 y.o. patient is here for short term rehabilitation post hospital admission from 01/04/15-01/07/15 post fall with left hip closed fracture. She underwent intramedullary nailing on 01/04/15. Her pain is under control. She has been working with therapy team. Has been constipated, had a small bowel movement last night post suppository. Was taking benefiber at home and would like it here. No other concerns.  Review of Systems:  Constitutional: Negative for fever, chills, diaphoresis.  HENT: Negative for headache, congestion, nasal discharge, hearing loss, earache, sore throat, difficulty swallowing.   Eyes: Negative for eye pain, blurred vision, double vision and discharge.  Respiratory: Negative for cough, shortness of breath and wheezing.   Cardiovascular: Negative for chest pain, palpitations, leg swelling.  Gastrointestinal: Negative for heartburn, nausea, vomiting, abdominal pain Genitourinary: Negative for dysuria Musculoskeletal: Negative for back pain, falls Skin: Negative for itching Neurological: Negative for dizziness, tingling, focal weakness Psychiatric/Behavioral: Negative for depression    Past Medical History  Diagnosis Date  . Hypertension   . Hyperlipidemia    Past Surgical History  Procedure Laterality Date  . Appendectomy  1954  . Intramedullary (im) nail intertrochanteric Left 01/04/2015    Procedure: INTRAMEDULLARY (IM) NAIL INTERTROCHANTRIC;  Surgeon: Netta Cedars, MD;  Location: [facility];  Service: Orthopedics;  Laterality: Left;   Social History:   reports that she has been smoking Cigarettes.  She has been smoking about 0.50 packs per day. She has never used  smokeless tobacco. She reports that she drinks about 7.0 oz of alcohol per week. She reports that she does not use illicit drugs.  Family History  Problem Relation Age of Onset  . Colon cancer Father 17    Medications:   Medication List       This list is accurate as of: 01/10/15 11:57 AM.  Always use your most recent med list.               acetaminophen 500 MG tablet  Commonly known as:  TYLENOL  Take 500 mg by mouth every 6 (six) hours as needed for mild pain or headache.     amLODipine 10 MG tablet  Commonly known as:  NORVASC  Take 10 mg by mouth daily.     aspirin 81 MG tablet  Take 81 mg by mouth daily.     benazepril 20 MG tablet  Commonly known as:  LOTENSIN  Take 20 mg by mouth daily.     betamethasone valerate lotion 0.1 %  Commonly known as:  VALISONE  Apply 1 application topically 2 (two) times daily.     bisacodyl 10 MG suppository  Commonly known as:  DULCOLAX  Place 1 suppository (10 mg total) rectally daily as needed for moderate constipation.     docusate sodium 100 MG capsule  Commonly known as:  COLACE  Take 1 capsule (100 mg total) by mouth 2 (two) times daily.     enoxaparin 40 MG/0.4ML injection  Commonly known as:  LOVENOX  Inject 0.4 mLs (40 mg total) into the skin daily. 30 days post op     HYDROcodone-acetaminophen 5-325 MG per tablet  Commonly known as:  NORCO  Take 1 tablet by mouth every 6 (six) hours as needed for moderate pain.     methocarbamol 500 MG tablet  Commonly known as:  ROBAXIN  Take 1 tablet (500 mg total) by mouth every 6 (six) hours as needed for muscle spasms.     metoprolol 50 MG tablet  Commonly known as:  LOPRESSOR  Take 50 mg by mouth daily.     ondansetron 4 MG tablet  Commonly known as:  ZOFRAN  Take 1 tablet (4 mg total) by mouth every 6 (six) hours as needed for nausea.     polyethylene glycol packet  Commonly known as:  MIRALAX / GLYCOLAX  Take 17 g by mouth daily as needed for mild constipation.           Physical Exam: Filed Vitals:   01/10/15 1156  BP: 131/65  Pulse: 81  Temp: 98.2 F (36.8 C)  Resp: 18  Weight: 157 lb 12.8 oz (71.578 kg)  SpO2: 98%    General- elderly female, in no acute distress Head- normocephalic, atraumatic Throat- moist mucus membrane Eyes- PERRLA, EOMI, no pallor, no icterus, no discharge, normal conjunctiva, normal sclera Neck- no cervical lymphadenopathy Cardiovascular- normal s1,s2, no murmurs, palpable dorsalis pedis and radial pulses, trace leg edema Respiratory- bilateral clear to auscultation, no wheeze, no rhonchi, no crackles, no use of accessory muscles Abdomen- bowel sounds present, soft, non tender Musculoskeletal- able to move all 4 extremities, limited left leg range of motion  Neurological- no focal deficit, alert and oriented to person, place and time Skin- warm and dry, left hip surgical incision with staples in place and healing well Psychiatry- normal mood and affect    Labs reviewed: Basic Metabolic Panel:  Recent Labs  01/04/15 1430 01/04/15 1441 01/05/15 0443 01/06/15 0452  NA 130* 129* 132* 130*  K 4.2 4.2 4.2 3.8  CL 95* 94* 98* 96*  CO2 26  --  28 29  GLUCOSE 118* 122* 158* 106*  BUN 13 13 9 14   CREATININE 0.71 0.70 0.61 0.42*  CALCIUM 9.1  --  8.3* 8.5*   Liver Function Tests:  Recent Labs  01/04/15 1430  AST 21  ALT 20  ALKPHOS 71  BILITOT 0.8  PROT 7.2  ALBUMIN 4.0   No results for input(s): LIPASE, AMYLASE in the last 8760 hours. No results for input(s): AMMONIA in the last 8760 hours. CBC:  Recent Labs  01/04/15 1430 01/04/15 1441 01/05/15 0443 01/06/15 0452  WBC 14.9*  --  9.7 11.6*  NEUTROABS 11.9*  --   --   --   HGB 12.7 13.6 10.9* 10.1*  HCT 36.5 40.0 31.6* 30.7*  MCV 93.1  --  94.6 94.8  PLT 264  --  214 212   Cardiac Enzymes: No results for input(s): CKTOTAL, CKMB, CKMBINDEX, TROPONINI in the last 8760 hours. BNP: Invalid input(s): POCBNP CBG: No results for  input(s): GLUCAP in the last 8760 hours.  Radiological Exams: Dg Chest 1 View  01/04/2015   CLINICAL DATA:  74 year old female with history of trauma from a fall yesterday evening.  EXAM: CHEST  1 VIEW  COMPARISON:  No priors.  FINDINGS: Lung volumes are normal. No consolidative airspace disease. No pleural effusions. No pneumothorax. No evidence of pulmonary edema. Heart size is borderline enlarged. The patient is rotated to the right on today's exam, resulting in distortion of the mediastinal contours and reduced diagnostic sensitivity and specificity for mediastinal pathology. Visualized bony thorax  is grossly intact. Old healed fractures of the lateral aspects of the right seventh and eighth ribs.  IMPRESSION: 1. No radiographic evidence of acute cardiopulmonary disease.   Electronically Signed   By: Vinnie Langton M.D.   On: 01/04/2015 14:25   Dg Knee 2 Views Left  01/04/2015   CLINICAL DATA:  Pt from home. Around 1800 last night, pt was sitting at the table. Left foot felt tingly and "asleep." Went to stand up and tripped over chair and fell. C/o left femur pain and lower back pain. Foreshortening of left leg noted, as well as an external rotation. *Best obtainable images due to limited movement of pt.  EXAM: LEFT KNEE - 1-2 VIEW  COMPARISON:  None.  FINDINGS: No fracture. There is mild medial joint space compartment narrowing. No other arthropathic change. Bone to demineralized. No bone lesion.  No joint effusion.  Vascular calcifications are noted posteriorly.  IMPRESSION: No fracture or acute finding.   Electronically Signed   By: Lajean Manes M.D.   On: 01/04/2015 14:27   Ct Head Wo Contrast  01/04/2015   CLINICAL DATA:  74 year old female with history of neck pain.  EXAM: CT HEAD WITHOUT CONTRAST  CT CERVICAL SPINE WITHOUT CONTRAST  TECHNIQUE: Multidetector CT imaging of the head and cervical spine was performed following the standard protocol without intravenous contrast. Multiplanar CT image  reconstructions of the cervical spine were also generated.  COMPARISON:  No priors.  FINDINGS: CT HEAD FINDINGS  Mild cerebral atrophy. Patchy and confluent areas of decreased attenuation are noted throughout the deep and periventricular white matter of the cerebral hemispheres bilaterally, compatible with chronic microvascular ischemic disease. 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 No acute intracranial abnormalities. Specifically, no evidence of acute intracranial hemorrhage, no definite findings of acute/subacute cerebral ischemia, no mass, mass effect, hydrocephalus or abnormal intra or extra-axial fluid collections. Visualized paranasal sinuses and mastoids are well pneumatized. No acute displaced skull fractures are identified.  CT CERVICAL SPINE FINDINGS  No acute displaced fracture of the cervical spine. Straightening of normal cervical lordosis, favored to be positional. Alignment is otherwise anatomic. Multilevel degenerative disc disease, most severe at C5-C6 and C6-C7. Multilevel facet arthropathy. Prevertebral soft tissues are normal. Visualized portions of the upper thorax are unremarkable.  IMPRESSION: 1. No acute abnormality of the cervical spine to account for the patient's symptoms. 2. 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 3. No acute intracranial abnormalities. 4. Mild cerebral atrophy and chronic microvascular ischemic changes throughout the deep and periventricular white matter of the cerebral hemispheres bilaterally. 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   Electronically Signed   By: Vinnie Langton M.D.   On: 01/04/2015 13:46   Ct Cervical Spine Wo Contrast  01/04/2015   CLINICAL DATA:  74 year old female with  history of neck pain.  EXAM: CT HEAD WITHOUT CONTRAST  CT CERVICAL SPINE WITHOUT CONTRAST  TECHNIQUE: Multidetector CT imaging of the head and cervical spine was performed following the standard protocol without intravenous contrast. Multiplanar CT image reconstructions of the cervical spine were also generated.  COMPARISON:  No priors.  FINDINGS: CT HEAD FINDINGS  Mild cerebral atrophy. Patchy and confluent areas of decreased attenuation are noted throughout the deep and periventricular white matter  of the cerebral hemispheres bilaterally, compatible with chronic microvascular ischemic disease. 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 No acute intracranial abnormalities. Specifically, no evidence of acute intracranial hemorrhage, no definite findings of acute/subacute cerebral ischemia, no mass, mass effect, hydrocephalus or abnormal intra or extra-axial fluid collections. Visualized paranasal sinuses and mastoids are well pneumatized. No acute displaced skull fractures are identified.  CT CERVICAL SPINE FINDINGS  No acute displaced fracture of the cervical spine. Straightening of normal cervical lordosis, favored to be positional. Alignment is otherwise anatomic. Multilevel degenerative disc disease, most severe at C5-C6 and C6-C7. Multilevel facet arthropathy. Prevertebral soft tissues are normal. Visualized portions of the upper thorax are unremarkable.  IMPRESSION: 1. No acute abnormality of the cervical spine to account for the patient's symptoms. 2. 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 3. No acute intracranial abnormalities. 4. Mild cerebral atrophy and chronic microvascular ischemic changes throughout the deep and periventricular white matter of the cerebral hemispheres bilaterally. There are also several old lacunar  infarcts in the head of the left caudate nucleus and medial aspect of the left temporal lobe.   Electronically Signed   By: Vinnie Langton M.D.   On: 01/04/2015 13:46   Pelvis Portable  01/04/2015   CLINICAL DATA:  Patient status post intra medullary nail proximal left femur.  EXAM: OPERATIVE LEFT HIP (WITH PELVIS IF PERFORMED) to VIEWS  TECHNIQUE: Fluoroscopic spot image(s) were submitted for interpretation post-operatively.  COMPARISON:  Radiograph 01/04/2015  FINDINGS: Patient status post intra medullary rod fixation of previously described left intratrochanteric femur fracture. Soft tissue gas and overlying staple line. Fracture appears in improved anatomic alignment. Lumbar spine degenerative changes. Right hip joint degenerative changes.  IMPRESSION: Patient status post ORIF left femoral neck fracture.   Electronically Signed   By: Lovey Newcomer M.D.   On: 01/04/2015 18:58   Dg Hip Operative Unilat With Pelvis Left  01/04/2015   CLINICAL DATA:  Patient status post intra medullary nail proximal left femur.  EXAM: OPERATIVE LEFT HIP (WITH PELVIS IF PERFORMED) to VIEWS  TECHNIQUE: Fluoroscopic spot image(s) were submitted for interpretation post-operatively.  COMPARISON:  Radiograph 01/04/2015  FINDINGS: Patient status post intra medullary rod fixation of previously described left intratrochanteric femur fracture. Soft tissue gas and overlying staple line. Fracture appears in improved anatomic alignment. Lumbar spine degenerative changes. Right hip joint degenerative changes.  IMPRESSION: Patient status post ORIF left femoral neck fracture.   Electronically Signed   By: Lovey Newcomer M.D.   On: 01/04/2015 18:58   Dg Hip Unilat With Pelvis 2-3 Views Left  01/04/2015   CLINICAL DATA:  Pt from home. Around 1800 last night, pt was sitting at the table. Left foot felt tingly and "asleep." Went to stand up and tripped over chair and fell. C/o left femur pain and lower back pain. Foreshortening of left leg noted, as  well as an external rotation. *Best obtainable images due to limited movement of pt.  EXAM: DG HIP (WITH OR WITHOUT PELVIS) 2-3V LEFT  COMPARISON:  None.  FINDINGS: There is an intertrochanteric fracture of the left proximal femur. Distal fracture component is displaced 19 mm lateral to the proximal fracture component. There is mild varus angulation. There is also apex anterior angulation. No significant comminution.  No other fractures. Bones are diffusely demineralized. Hip joints are normally aligned.  Soft  tissues show dense iliac and femoral artery vascular calcifications.  IMPRESSION: Mildly displaced and angulated intertrochanteric fracture of the left proximal femur.   Electronically Signed   By: Lajean Manes M.D.   On: 01/04/2015 14:26   Dg Femur Min 2 Views Left  01/04/2015   CLINICAL DATA:  74 year old female with history of trauma from a fall yesterday complaining of left hip pain.  EXAM: LEFT FEMUR 2 VIEWS  COMPARISON:  No priors.  FINDINGS: Five views of the left femur demonstrate an acute mildly displace and angulated intertrochanteric fracture of the left hip, with approximately 20 degrees of varus angulation. Femoral head appears properly located within the left acetabulum on the frontal projection.  IMPRESSION: 1. Acute minimally displaced mildly angulated intertrochanteric fracture of the left hip, as above.   Electronically Signed   By: Vinnie Langton M.D.   On: 01/04/2015 14:27     Assessment/Plan  Left hip fracture S/p intramedullary nailing. WBAT. Will have her work with physical therapy and occupational therapy team to help with gait training and muscle strengthening exercises.fall precautions. Skin care. Encourage to be out of bed. Continue norco 5/325 mg q6h prn pain with robaxin 500 mg q6h prn muscle spasm. Continue lovenox for dvt prophylaxis. has follow up with orthopedics  Constipation Add benefiber drink mix and colace 100 mg po bid and reassess. Continue prn miralax and  dulcolax suppository  Post op blood loss anemia Monitor h&h  Leukocytosis No signs of infection., likely post op reactive leukocytosis. Monitor clinically  Hypertension Stable. continue Metoprolol 50 mg daily and Amlodipine 10 mg daily, monitor bp   Goals of care: short term rehabilitation   Labs/tests ordered: cbc with diff, bmp  Family/ staff Communication: reviewed care plan with patient and nursing supervisor    Blanchie Serve, MD  [employer] [phone] (Monday-Friday 8 am - 5 pm) [phone] (afterhours)

## 2015-01-31 ENCOUNTER — Encounter: Payer: Self-pay | Admitting: Adult Health

## 2015-01-31 ENCOUNTER — Non-Acute Institutional Stay (SKILLED_NURSING_FACILITY): Payer: Medicare HMO | Admitting: Adult Health

## 2015-01-31 DIAGNOSIS — Z720 Tobacco use: Secondary | ICD-10-CM | POA: Diagnosis not present

## 2015-01-31 DIAGNOSIS — S72002S Fracture of unspecified part of neck of left femur, sequela: Secondary | ICD-10-CM | POA: Diagnosis not present

## 2015-01-31 DIAGNOSIS — I10 Essential (primary) hypertension: Secondary | ICD-10-CM

## 2015-01-31 DIAGNOSIS — K5900 Constipation, unspecified: Secondary | ICD-10-CM | POA: Diagnosis not present

## 2015-01-31 DIAGNOSIS — D62 Acute posthemorrhagic anemia: Secondary | ICD-10-CM | POA: Diagnosis not present

## 2015-02-01 NOTE — Progress Notes (Signed)
Patient ID: [name], female   DOB: 19-May-1941, 74 y.o.   MRN: [id number]    DATE:  01/31/15 MRN:  [id number]  BIRTHDAY: 1941/04/16  Facility:  Nursing Home Location:  [facility] Room Number: 807-P  LEVEL OF CARE:  [phone])  Contact Information    Name Relation Home Work Mobile   Nunley,Blake Spouse [phone]  [phone]   Taunja, Brickner   [phone]       Chief Complaint  Patient presents with  . Discharge Note    Left hip fracture S/P intramedullary nail, hypertension, hyponatremia, constipation, tobacco abuse and anemia    HISTORY OF PRESENT ILLNESS:  This is a 74 year old female who is for discharge home with home health PT for endurance, OT for ADLs, skilled nurse for medication management and CNA for showers. DME: Lightweight wheelchair standard with leg rests,  anti-tippers, cushion and 3-in-1 bedside commode. She will obtain a chair lift @ home since she has LLE touch toe weight bearing status.  She has been admitted to [facility] on 01/07/15. She has PMH of hypertension and hyperlipidemia. She had a fall @ home and sustained a left hip fracture and had intramedullary nail intertrochatric 8/16.   Patient was admitted to this facility for short-term rehabilitation after the patient's recent hospitalization.  Patient has completed SNF rehabilitation and therapy has cleared the patient for discharge.   PAST MEDICAL HISTORY:  Past Medical History  Diagnosis Date  . Hypertension   . Hyperlipidemia     CURRENT MEDICATIONS: Reviewed  Patient's Medications  New Prescriptions   No medications on file  Previous Medications   ACETAMINOPHEN (TYLENOL) 500 MG TABLET    Take 500 mg by mouth every 6 (six) hours as needed for mild pain or headache.   AMLODIPINE (NORVASC) 10 MG TABLET    Take 10 mg by mouth daily.   ASPIRIN 81 MG TABLET    Take 81 mg by mouth daily.   BENAZEPRIL (LOTENSIN) 20 MG TABLET    Take 20 mg by mouth daily.   BETAMETHASONE VALERATE LOTION (VALISONE) 0.1 %    Apply 1 application topically 2 (two) times daily.   BISACODYL (DULCOLAX) 10 MG SUPPOSITORY    Place 1 suppository (10 mg total) rectally daily as needed for moderate constipation.   DOCUSATE SODIUM (COLACE) 100 MG CAPSULE    Take 1 capsule (100 mg total) by mouth 2 (two) times daily.   ENOXAPARIN (LOVENOX) 40 MG/0.4ML INJECTION    Inject 0.4 mLs (40 mg total) into the skin daily. 30 days post op   HYDROCODONE-ACETAMINOPHEN (NORCO) 5-325 MG PER TABLET    Take 1 tablet by mouth every 6 (six) hours as needed for moderate pain.   METHOCARBAMOL (ROBAXIN) 500 MG TABLET    Take 1 tablet (500 mg total) by mouth every 6 (six) hours as needed for muscle spasms.   METOPROLOL (LOPRESSOR) 50 MG TABLET    Take 50 mg by mouth daily.   ONDANSETRON (ZOFRAN) 4 MG TABLET    Take 1 tablet (4 mg total) by mouth every 6 (six) hours as needed for nausea.   POLYETHYLENE GLYCOL (MIRALAX / GLYCOLAX) PACKET    Take 17 g by mouth daily as needed for mild constipation.  Modified Medications   No medications on file  Discontinued Medications   No medications on file     Allergies  Allergen Reactions  . Codeine Hives    REVIEW OF SYSTEMS:  GENERAL: no  change in appetite, no fatigue, no weight changes, no fever, chills or weakness EYES: Denies change in vision, dry eyes, eye pain, itching or discharge EARS: Denies change in hearing, ringing in ears, or earache NOSE: Denies nasal congestion or epistaxis MOUTH and THROAT: Denies oral discomfort, gingival pain or bleeding, pain from teeth or hoarseness   RESPIRATORY: no cough, SOB, DOE, wheezing, hemoptysis CARDIAC: no chest pain, edema or palpitations GI: no abdominal pain, diarrhea, constipation, heart burn, nausea or vomiting GU: Denies dysuria, frequency, hematuria, incontinence, or discharge PSYCHIATRIC: Denies feeling of depression or anxiety. No report of hallucinations, insomnia, paranoia, or  agitation   PHYSICAL EXAMINATION  GENERAL APPEARANCE: Well nourished. In no acute distress. Normal body habitus SKIN:  Left hip surgical site has steri-strips, dry dressing, no redness HEAD: Normal in size and contour. No evidence of trauma EYES: Lids open and close normally. No blepharitis, entropion or ectropion. PERRL. Conjunctivae are clear and sclerae are white. Lenses are without opacity EARS: Pinnae are normal. Patient hears normal voice tunes of the examiner MOUTH and THROAT: Lips are without lesions. Oral mucosa is moist and without lesions. Tongue is normal in shape, size, and color and without lesions NECK: supple, trachea midline, no neck masses, no thyroid tenderness, no thyromegaly LYMPHATICS: no LAN in the neck, no supraclavicular LAN RESPIRATORY: breathing is even & unlabored, BS CTAB CARDIAC: RRR, no murmur,no extra heart sounds, no edema GI: abdomen soft, normal BS, no masses, no tenderness, no hepatomegaly, no splenomegaly EXTREMITIES:  Able to move X 4 extremities PSYCHIATRIC: Alert and oriented X 3. Affect and behavior are appropriate  LABS/RADIOLOGY: Labs reviewed: 01/13/15  WBC 9.3 hemoglobin 10.6 hematocrit 31.8 MCV 92.4 platelet count 405 sodium 135 potassium 4.3 glucose 86 BUN 13 creatinine 0.54 calcium 9.6 Basic Metabolic Panel:  Recent Labs  01/04/15 1430 01/04/15 1441 01/05/15 0443 01/06/15 0452  NA 130* 129* 132* 130*  K 4.2 4.2 4.2 3.8  CL 95* 94* 98* 96*  CO2 26  --  28 29  GLUCOSE 118* 122* 158* 106*  BUN 13 13 9 14   CREATININE 0.71 0.70 0.61 0.42*  CALCIUM 9.1  --  8.3* 8.5*   Liver Function Tests:  Recent Labs  01/04/15 1430  AST 21  ALT 20  ALKPHOS 71  BILITOT 0.8  PROT 7.2  ALBUMIN 4.0   CBC:  Recent Labs  01/04/15 1430 01/04/15 1441 01/05/15 0443 01/06/15 0452  WBC 14.9*  --  9.7 11.6*  NEUTROABS 11.9*  --   --   --   HGB 12.7 13.6 10.9* 10.1*  HCT 36.5 40.0 31.6* 30.7*  MCV 93.1  --  94.6 94.8  PLT 264  --  214 212     Dg Chest 1 View  01/04/2015   CLINICAL DATA:  74 year old female with history of trauma from a fall yesterday evening.  EXAM: CHEST  1 VIEW  COMPARISON:  No priors.  FINDINGS: Lung volumes are normal. No consolidative airspace disease. No pleural effusions. No pneumothorax. No evidence of pulmonary edema. Heart size is borderline enlarged. The patient is rotated to the right on today's exam, resulting in distortion of the mediastinal contours and reduced diagnostic sensitivity and specificity for mediastinal pathology. Visualized bony thorax is grossly intact. Old healed fractures of the lateral aspects of the right seventh and eighth ribs.  IMPRESSION: 1. No radiographic evidence of acute cardiopulmonary disease.   Electronically Signed   By: Vinnie Langton M.D.   On: 01/04/2015 14:25   Dg  Knee 2 Views Left  01/04/2015   CLINICAL DATA:  Pt from home. Around 1800 last night, pt was sitting at the table. Left foot felt tingly and "asleep." Went to stand up and tripped over chair and fell. C/o left femur pain and lower back pain. Foreshortening of left leg noted, as well as an external rotation. *Best obtainable images due to limited movement of pt.  EXAM: LEFT KNEE - 1-2 VIEW  COMPARISON:  None.  FINDINGS: No fracture. There is mild medial joint space compartment narrowing. No other arthropathic change. Bone to demineralized. No bone lesion.  No joint effusion.  Vascular calcifications are noted posteriorly.  IMPRESSION: No fracture or acute finding.   Electronically Signed   By: Lajean Manes M.D.   On: 01/04/2015 14:27   Ct Head Wo Contrast  01/04/2015   CLINICAL DATA:  74 year old female with history of neck pain.  EXAM: CT HEAD WITHOUT CONTRAST  CT CERVICAL SPINE WITHOUT CONTRAST  TECHNIQUE: Multidetector CT imaging of the head and cervical spine was performed following the standard protocol without intravenous contrast. Multiplanar CT image reconstructions of the cervical spine were also generated.   COMPARISON:  No priors.  FINDINGS: CT HEAD FINDINGS  Mild cerebral atrophy. Patchy and confluent areas of decreased attenuation are noted throughout the deep and periventricular white matter of the cerebral hemispheres bilaterally, compatible with chronic microvascular ischemic disease. 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 No acute intracranial abnormalities. Specifically, no evidence of acute intracranial hemorrhage, no definite findings of acute/subacute cerebral ischemia, no mass, mass effect, hydrocephalus or abnormal intra or extra-axial fluid collections. Visualized paranasal sinuses and mastoids are well pneumatized. No acute displaced skull fractures are identified.  CT CERVICAL SPINE FINDINGS  No acute displaced fracture of the cervical spine. Straightening of normal cervical lordosis, favored to be positional. Alignment is otherwise anatomic. Multilevel degenerative disc disease, most severe at C5-C6 and C6-C7. Multilevel facet arthropathy. Prevertebral soft tissues are normal. Visualized portions of the upper thorax are unremarkable.  IMPRESSION: 1. No acute abnormality of the cervical spine to account for the patient's symptoms. 2. 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 3. No acute intracranial abnormalities. 4. Mild cerebral atrophy and chronic microvascular ischemic changes throughout the deep and periventricular white matter of the cerebral hemispheres bilaterally. 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   Electronically Signed   By: Vinnie Langton M.D.   On: 01/04/2015 13:46   Ct Cervical Spine Wo Contrast  01/04/2015   CLINICAL DATA:  74 year old female with history of neck pain.  EXAM: CT HEAD WITHOUT CONTRAST  CT  CERVICAL SPINE WITHOUT CONTRAST  TECHNIQUE: Multidetector CT imaging of the head and cervical spine was performed following the standard protocol without intravenous contrast. Multiplanar CT image reconstructions of the cervical spine were also generated.  COMPARISON:  No priors.  FINDINGS: CT HEAD FINDINGS  Mild cerebral atrophy. Patchy and confluent areas of decreased attenuation are noted throughout the deep and periventricular white matter of the cerebral hemispheres bilaterally, compatible with chronic microvascular ischemic disease. 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 No acute intracranial abnormalities. Specifically, no evidence  of acute intracranial hemorrhage, no definite findings of acute/subacute cerebral ischemia, no mass, mass effect, hydrocephalus or abnormal intra or extra-axial fluid collections. Visualized paranasal sinuses and mastoids are well pneumatized. No acute displaced skull fractures are identified.  CT CERVICAL SPINE FINDINGS  No acute displaced fracture of the cervical spine. Straightening of normal cervical lordosis, favored to be positional. Alignment is otherwise anatomic. Multilevel degenerative disc disease, most severe at C5-C6 and C6-C7. Multilevel facet arthropathy. Prevertebral soft tissues are normal. Visualized portions of the upper thorax are unremarkable.  IMPRESSION: 1. No acute abnormality of the cervical spine to account for the patient's symptoms. 2. 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 3. No acute intracranial abnormalities. 4. Mild cerebral atrophy and chronic microvascular ischemic changes throughout the deep and periventricular white matter of the cerebral hemispheres bilaterally. 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   Electronically Signed   By: Vinnie Langton M.D.   On: 01/04/2015 13:46   Pelvis Portable  01/04/2015   CLINICAL DATA:  Patient status post intra medullary nail proximal left femur.  EXAM: OPERATIVE LEFT HIP (WITH PELVIS IF PERFORMED) to VIEWS  TECHNIQUE: Fluoroscopic spot image(s) were submitted for interpretation post-operatively.  COMPARISON:  Radiograph 01/04/2015  FINDINGS: Patient status post intra medullary rod fixation of previously described left intratrochanteric femur fracture. Soft tissue gas and overlying staple line. Fracture appears in improved anatomic alignment. Lumbar spine degenerative changes. Right hip joint degenerative changes.  IMPRESSION: Patient status post ORIF left femoral neck fracture.   Electronically Signed   By: Lovey Newcomer M.D.   On: 01/04/2015 18:58   Dg Chest Port 1 View  01/06/2015   CLINICAL DATA:  Short of breath  EXAM: PORTABLE CHEST - 1 VIEW  COMPARISON:  01/04/2015  FINDINGS: Heart size upper normal. Negative for heart failure. Lungs are clear without infiltrate or effusion.  IMPRESSION: No active disease.   Electronically Signed   By: Franchot Gallo M.D.   On: 01/06/2015 10:42   Dg Hip Operative Unilat With Pelvis Left  01/04/2015   CLINICAL DATA:  Patient status post intra medullary nail proximal left femur.  EXAM: OPERATIVE LEFT HIP (WITH PELVIS IF PERFORMED) to VIEWS  TECHNIQUE: Fluoroscopic spot image(s) were submitted for interpretation post-operatively.  COMPARISON:  Radiograph 01/04/2015  FINDINGS: Patient status post intra medullary rod fixation of previously described left intratrochanteric femur fracture. Soft tissue gas and overlying staple line. Fracture appears in improved anatomic alignment. Lumbar spine degenerative changes. Right hip joint degenerative changes.  IMPRESSION: Patient status post ORIF left femoral neck fracture.   Electronically Signed   By: Lovey Newcomer M.D.   On: 01/04/2015 18:58   Dg Hip Unilat  With Pelvis 2-3 Views Left  01/04/2015   CLINICAL DATA:  Pt from home. Around 1800 last night, pt was sitting at the table. Left foot felt tingly and "asleep." Went to stand up and tripped over chair and fell. C/o left femur pain and lower back pain. Foreshortening of left leg noted, as well as an external rotation. *Best obtainable images due to limited movement of pt.  EXAM: DG HIP (WITH OR WITHOUT PELVIS) 2-3V LEFT  COMPARISON:  None.  FINDINGS: There is an intertrochanteric fracture of the left proximal femur. Distal fracture component is displaced 19 mm lateral to the proximal fracture component. There is mild varus angulation. There is also apex  anterior angulation. No significant comminution.  No other fractures. Bones are diffusely demineralized. Hip joints are normally aligned.  Soft tissues show dense iliac and femoral artery vascular calcifications.  IMPRESSION: Mildly displaced and angulated intertrochanteric fracture of the left proximal femur.   Electronically Signed   By: Lajean Manes M.D.   On: 01/04/2015 14:26   Dg Femur Min 2 Views Left  01/04/2015   CLINICAL DATA:  74 year old female with history of trauma from a fall yesterday complaining of left hip pain.  EXAM: LEFT FEMUR 2 VIEWS  COMPARISON:  No priors.  FINDINGS: Five views of the left femur demonstrate an acute mildly displace and angulated intertrochanteric fracture of the left hip, with approximately 20 degrees of varus angulation. Femoral head appears properly located within the left acetabulum on the frontal projection.  IMPRESSION: 1. Acute minimally displaced mildly angulated intertrochanteric fracture of the left hip, as above.   Electronically Signed   By: Vinnie Langton M.D.   On: 01/04/2015 14:27    ASSESSMENT/PLAN:  Left hip fracture S/P IM nail - for home health PT, OT, skilled nurse and CNA; follow-up with Dr. Veverly Fells, orthopedic surgery;  Norco 5/325 mg 1 tab PO Q 6 hours PRN for pain; and Robaxin 500 mg 1 tab PO Q 6  hours PRN for muscle spasm; LLE toe touch weight bearing  Hypertension - continue Metoprolol 50 mg 1 tab PO Q D and Amlodipine 10 mg 1 tab PO Q D  Constipation - continue Benefiber or drink Minix daily, Colace 100 mg 1 capsule PO BID and Miralax 17 gm PO daly PRN  Anemia, acute blood loss - hgb 10.6; stable  Tobacco Abuse - continue nicotine patch 21 mg/day 1 patch transdermally daily      I have filled out patient's discharge paperwork and written prescriptions.  Patient will receive home health PT, OT, Nursing and CNA.  DME provided:  Lightweight wheelchair standard with leg rests,  anti-tippers, cushion and 3-in-1 bedside commode  Total discharge time: Greater than 30 minutes  Discharge time involved coordination of the discharge process with Education officer, museum, nursing staff and therapy department. Medical justification for home health services/DME verified.     [facility], NP [employer] [phone]

## 2015-02-05 DIAGNOSIS — Z7982 Long term (current) use of aspirin: Secondary | ICD-10-CM | POA: Diagnosis not present

## 2015-02-05 DIAGNOSIS — Z9181 History of falling: Secondary | ICD-10-CM | POA: Diagnosis not present

## 2015-02-05 DIAGNOSIS — S72142D Displaced intertrochanteric fracture of left femur, subsequent encounter for closed fracture with routine healing: Secondary | ICD-10-CM | POA: Diagnosis not present

## 2015-02-05 DIAGNOSIS — M542 Cervicalgia: Secondary | ICD-10-CM | POA: Diagnosis not present

## 2015-02-05 DIAGNOSIS — I10 Essential (primary) hypertension: Secondary | ICD-10-CM | POA: Diagnosis not present

## 2015-02-05 DIAGNOSIS — E785 Hyperlipidemia, unspecified: Secondary | ICD-10-CM | POA: Diagnosis not present

## 2015-03-01 DIAGNOSIS — S72142D Displaced intertrochanteric fracture of left femur, subsequent encounter for closed fracture with routine healing: Secondary | ICD-10-CM | POA: Diagnosis not present

## 2015-03-01 DIAGNOSIS — M6281 Muscle weakness (generalized): Secondary | ICD-10-CM | POA: Diagnosis not present

## 2015-03-01 DIAGNOSIS — Z9181 History of falling: Secondary | ICD-10-CM | POA: Diagnosis not present

## 2015-03-01 DIAGNOSIS — Z4789 Encounter for other orthopedic aftercare: Secondary | ICD-10-CM | POA: Diagnosis not present

## 2015-03-04 DIAGNOSIS — M542 Cervicalgia: Secondary | ICD-10-CM | POA: Diagnosis not present

## 2015-03-04 DIAGNOSIS — S72142D Displaced intertrochanteric fracture of left femur, subsequent encounter for closed fracture with routine healing: Secondary | ICD-10-CM | POA: Diagnosis not present

## 2015-03-04 DIAGNOSIS — Z9181 History of falling: Secondary | ICD-10-CM | POA: Diagnosis not present

## 2015-03-04 DIAGNOSIS — I10 Essential (primary) hypertension: Secondary | ICD-10-CM | POA: Diagnosis not present

## 2015-03-04 DIAGNOSIS — E785 Hyperlipidemia, unspecified: Secondary | ICD-10-CM | POA: Diagnosis not present

## 2015-03-06 DIAGNOSIS — Z9181 History of falling: Secondary | ICD-10-CM | POA: Diagnosis not present

## 2015-03-06 DIAGNOSIS — I10 Essential (primary) hypertension: Secondary | ICD-10-CM | POA: Diagnosis not present

## 2015-03-06 DIAGNOSIS — M542 Cervicalgia: Secondary | ICD-10-CM | POA: Diagnosis not present

## 2015-03-06 DIAGNOSIS — E785 Hyperlipidemia, unspecified: Secondary | ICD-10-CM | POA: Diagnosis not present

## 2015-03-06 DIAGNOSIS — S72142D Displaced intertrochanteric fracture of left femur, subsequent encounter for closed fracture with routine healing: Secondary | ICD-10-CM | POA: Diagnosis not present

## 2015-03-11 DIAGNOSIS — S72142D Displaced intertrochanteric fracture of left femur, subsequent encounter for closed fracture with routine healing: Secondary | ICD-10-CM | POA: Diagnosis not present

## 2015-03-11 DIAGNOSIS — M542 Cervicalgia: Secondary | ICD-10-CM | POA: Diagnosis not present

## 2015-03-11 DIAGNOSIS — I10 Essential (primary) hypertension: Secondary | ICD-10-CM | POA: Diagnosis not present

## 2015-03-11 DIAGNOSIS — Z9181 History of falling: Secondary | ICD-10-CM | POA: Diagnosis not present

## 2015-03-11 DIAGNOSIS — E785 Hyperlipidemia, unspecified: Secondary | ICD-10-CM | POA: Diagnosis not present

## 2015-03-13 DIAGNOSIS — Z9181 History of falling: Secondary | ICD-10-CM | POA: Diagnosis not present

## 2015-03-13 DIAGNOSIS — S72142D Displaced intertrochanteric fracture of left femur, subsequent encounter for closed fracture with routine healing: Secondary | ICD-10-CM | POA: Diagnosis not present

## 2015-03-13 DIAGNOSIS — M542 Cervicalgia: Secondary | ICD-10-CM | POA: Diagnosis not present

## 2015-03-13 DIAGNOSIS — E785 Hyperlipidemia, unspecified: Secondary | ICD-10-CM | POA: Diagnosis not present

## 2015-03-13 DIAGNOSIS — I10 Essential (primary) hypertension: Secondary | ICD-10-CM | POA: Diagnosis not present

## 2015-03-17 DIAGNOSIS — S72142D Displaced intertrochanteric fracture of left femur, subsequent encounter for closed fracture with routine healing: Secondary | ICD-10-CM | POA: Diagnosis not present

## 2015-03-17 DIAGNOSIS — Z9181 History of falling: Secondary | ICD-10-CM | POA: Diagnosis not present

## 2015-03-17 DIAGNOSIS — M542 Cervicalgia: Secondary | ICD-10-CM | POA: Diagnosis not present

## 2015-03-17 DIAGNOSIS — E785 Hyperlipidemia, unspecified: Secondary | ICD-10-CM | POA: Diagnosis not present

## 2015-03-17 DIAGNOSIS — I10 Essential (primary) hypertension: Secondary | ICD-10-CM | POA: Diagnosis not present

## 2015-03-18 DIAGNOSIS — Z4789 Encounter for other orthopedic aftercare: Secondary | ICD-10-CM | POA: Diagnosis not present

## 2015-03-18 DIAGNOSIS — S72142D Displaced intertrochanteric fracture of left femur, subsequent encounter for closed fracture with routine healing: Secondary | ICD-10-CM | POA: Diagnosis not present

## 2015-03-20 DIAGNOSIS — I10 Essential (primary) hypertension: Secondary | ICD-10-CM | POA: Diagnosis not present

## 2015-03-20 DIAGNOSIS — M542 Cervicalgia: Secondary | ICD-10-CM | POA: Diagnosis not present

## 2015-03-20 DIAGNOSIS — Z9181 History of falling: Secondary | ICD-10-CM | POA: Diagnosis not present

## 2015-03-20 DIAGNOSIS — S72142D Displaced intertrochanteric fracture of left femur, subsequent encounter for closed fracture with routine healing: Secondary | ICD-10-CM | POA: Diagnosis not present

## 2015-03-20 DIAGNOSIS — E785 Hyperlipidemia, unspecified: Secondary | ICD-10-CM | POA: Diagnosis not present

## 2015-03-25 DIAGNOSIS — M542 Cervicalgia: Secondary | ICD-10-CM | POA: Diagnosis not present

## 2015-03-25 DIAGNOSIS — I10 Essential (primary) hypertension: Secondary | ICD-10-CM | POA: Diagnosis not present

## 2015-03-25 DIAGNOSIS — Z9181 History of falling: Secondary | ICD-10-CM | POA: Diagnosis not present

## 2015-03-25 DIAGNOSIS — S72142D Displaced intertrochanteric fracture of left femur, subsequent encounter for closed fracture with routine healing: Secondary | ICD-10-CM | POA: Diagnosis not present

## 2015-03-25 DIAGNOSIS — E785 Hyperlipidemia, unspecified: Secondary | ICD-10-CM | POA: Diagnosis not present

## 2015-03-27 DIAGNOSIS — Z9181 History of falling: Secondary | ICD-10-CM | POA: Diagnosis not present

## 2015-03-27 DIAGNOSIS — E785 Hyperlipidemia, unspecified: Secondary | ICD-10-CM | POA: Diagnosis not present

## 2015-03-27 DIAGNOSIS — I10 Essential (primary) hypertension: Secondary | ICD-10-CM | POA: Diagnosis not present

## 2015-03-27 DIAGNOSIS — M542 Cervicalgia: Secondary | ICD-10-CM | POA: Diagnosis not present

## 2015-03-27 DIAGNOSIS — S72142D Displaced intertrochanteric fracture of left femur, subsequent encounter for closed fracture with routine healing: Secondary | ICD-10-CM | POA: Diagnosis not present

## 2015-04-01 DIAGNOSIS — M542 Cervicalgia: Secondary | ICD-10-CM | POA: Diagnosis not present

## 2015-04-01 DIAGNOSIS — S72142D Displaced intertrochanteric fracture of left femur, subsequent encounter for closed fracture with routine healing: Secondary | ICD-10-CM | POA: Diagnosis not present

## 2015-04-01 DIAGNOSIS — E785 Hyperlipidemia, unspecified: Secondary | ICD-10-CM | POA: Diagnosis not present

## 2015-04-01 DIAGNOSIS — M6281 Muscle weakness (generalized): Secondary | ICD-10-CM | POA: Diagnosis not present

## 2015-04-01 DIAGNOSIS — I10 Essential (primary) hypertension: Secondary | ICD-10-CM | POA: Diagnosis not present

## 2015-04-01 DIAGNOSIS — Z9181 History of falling: Secondary | ICD-10-CM | POA: Diagnosis not present

## 2015-04-01 DIAGNOSIS — Z4789 Encounter for other orthopedic aftercare: Secondary | ICD-10-CM | POA: Diagnosis not present

## 2015-04-03 DIAGNOSIS — Z9181 History of falling: Secondary | ICD-10-CM | POA: Diagnosis not present

## 2015-04-03 DIAGNOSIS — S72142D Displaced intertrochanteric fracture of left femur, subsequent encounter for closed fracture with routine healing: Secondary | ICD-10-CM | POA: Diagnosis not present

## 2015-04-03 DIAGNOSIS — M542 Cervicalgia: Secondary | ICD-10-CM | POA: Diagnosis not present

## 2015-04-03 DIAGNOSIS — I10 Essential (primary) hypertension: Secondary | ICD-10-CM | POA: Diagnosis not present

## 2015-04-03 DIAGNOSIS — E785 Hyperlipidemia, unspecified: Secondary | ICD-10-CM | POA: Diagnosis not present

## 2015-04-06 DIAGNOSIS — I10 Essential (primary) hypertension: Secondary | ICD-10-CM | POA: Diagnosis not present

## 2015-04-06 DIAGNOSIS — E785 Hyperlipidemia, unspecified: Secondary | ICD-10-CM | POA: Diagnosis not present

## 2015-04-06 DIAGNOSIS — Z7982 Long term (current) use of aspirin: Secondary | ICD-10-CM | POA: Diagnosis not present

## 2015-04-06 DIAGNOSIS — Z9181 History of falling: Secondary | ICD-10-CM | POA: Diagnosis not present

## 2015-04-06 DIAGNOSIS — M542 Cervicalgia: Secondary | ICD-10-CM | POA: Diagnosis not present

## 2015-04-06 DIAGNOSIS — S72142D Displaced intertrochanteric fracture of left femur, subsequent encounter for closed fracture with routine healing: Secondary | ICD-10-CM | POA: Diagnosis not present

## 2015-04-07 DIAGNOSIS — S72142D Displaced intertrochanteric fracture of left femur, subsequent encounter for closed fracture with routine healing: Secondary | ICD-10-CM | POA: Diagnosis not present

## 2015-04-07 DIAGNOSIS — M542 Cervicalgia: Secondary | ICD-10-CM | POA: Diagnosis not present

## 2015-04-07 DIAGNOSIS — Z9181 History of falling: Secondary | ICD-10-CM | POA: Diagnosis not present

## 2015-04-07 DIAGNOSIS — I10 Essential (primary) hypertension: Secondary | ICD-10-CM | POA: Diagnosis not present

## 2015-04-07 DIAGNOSIS — E785 Hyperlipidemia, unspecified: Secondary | ICD-10-CM | POA: Diagnosis not present

## 2015-04-07 DIAGNOSIS — Z7982 Long term (current) use of aspirin: Secondary | ICD-10-CM | POA: Diagnosis not present

## 2015-04-10 DIAGNOSIS — Z7982 Long term (current) use of aspirin: Secondary | ICD-10-CM | POA: Diagnosis not present

## 2015-04-10 DIAGNOSIS — E785 Hyperlipidemia, unspecified: Secondary | ICD-10-CM | POA: Diagnosis not present

## 2015-04-10 DIAGNOSIS — M542 Cervicalgia: Secondary | ICD-10-CM | POA: Diagnosis not present

## 2015-04-10 DIAGNOSIS — Z9181 History of falling: Secondary | ICD-10-CM | POA: Diagnosis not present

## 2015-04-10 DIAGNOSIS — S72142D Displaced intertrochanteric fracture of left femur, subsequent encounter for closed fracture with routine healing: Secondary | ICD-10-CM | POA: Diagnosis not present

## 2015-04-10 DIAGNOSIS — I10 Essential (primary) hypertension: Secondary | ICD-10-CM | POA: Diagnosis not present

## 2015-04-14 DIAGNOSIS — E785 Hyperlipidemia, unspecified: Secondary | ICD-10-CM | POA: Diagnosis not present

## 2015-04-14 DIAGNOSIS — M542 Cervicalgia: Secondary | ICD-10-CM | POA: Diagnosis not present

## 2015-04-14 DIAGNOSIS — S72142D Displaced intertrochanteric fracture of left femur, subsequent encounter for closed fracture with routine healing: Secondary | ICD-10-CM | POA: Diagnosis not present

## 2015-04-14 DIAGNOSIS — I10 Essential (primary) hypertension: Secondary | ICD-10-CM | POA: Diagnosis not present

## 2015-04-14 DIAGNOSIS — Z9181 History of falling: Secondary | ICD-10-CM | POA: Diagnosis not present

## 2015-04-14 DIAGNOSIS — Z7982 Long term (current) use of aspirin: Secondary | ICD-10-CM | POA: Diagnosis not present

## 2015-04-16 DIAGNOSIS — M79672 Pain in left foot: Secondary | ICD-10-CM | POA: Diagnosis not present

## 2015-04-16 DIAGNOSIS — M25511 Pain in right shoulder: Secondary | ICD-10-CM | POA: Diagnosis not present

## 2015-04-16 DIAGNOSIS — S72142D Displaced intertrochanteric fracture of left femur, subsequent encounter for closed fracture with routine healing: Secondary | ICD-10-CM | POA: Diagnosis not present

## 2015-04-16 DIAGNOSIS — Z4789 Encounter for other orthopedic aftercare: Secondary | ICD-10-CM | POA: Diagnosis not present

## 2015-04-17 DIAGNOSIS — E785 Hyperlipidemia, unspecified: Secondary | ICD-10-CM | POA: Diagnosis not present

## 2015-04-17 DIAGNOSIS — I10 Essential (primary) hypertension: Secondary | ICD-10-CM | POA: Diagnosis not present

## 2015-04-17 DIAGNOSIS — S72142D Displaced intertrochanteric fracture of left femur, subsequent encounter for closed fracture with routine healing: Secondary | ICD-10-CM | POA: Diagnosis not present

## 2015-04-17 DIAGNOSIS — Z7982 Long term (current) use of aspirin: Secondary | ICD-10-CM | POA: Diagnosis not present

## 2015-04-17 DIAGNOSIS — Z9181 History of falling: Secondary | ICD-10-CM | POA: Diagnosis not present

## 2015-04-17 DIAGNOSIS — M542 Cervicalgia: Secondary | ICD-10-CM | POA: Diagnosis not present

## 2015-05-01 DIAGNOSIS — Z9181 History of falling: Secondary | ICD-10-CM | POA: Diagnosis not present

## 2015-05-01 DIAGNOSIS — Z4789 Encounter for other orthopedic aftercare: Secondary | ICD-10-CM | POA: Diagnosis not present

## 2015-05-01 DIAGNOSIS — M6281 Muscle weakness (generalized): Secondary | ICD-10-CM | POA: Diagnosis not present

## 2015-05-01 DIAGNOSIS — S72142D Displaced intertrochanteric fracture of left femur, subsequent encounter for closed fracture with routine healing: Secondary | ICD-10-CM | POA: Diagnosis not present

## 2015-05-13 DIAGNOSIS — Z23 Encounter for immunization: Secondary | ICD-10-CM | POA: Diagnosis not present

## 2015-06-01 DIAGNOSIS — Z4789 Encounter for other orthopedic aftercare: Secondary | ICD-10-CM | POA: Diagnosis not present

## 2015-06-01 DIAGNOSIS — Z9181 History of falling: Secondary | ICD-10-CM | POA: Diagnosis not present

## 2015-06-01 DIAGNOSIS — M6281 Muscle weakness (generalized): Secondary | ICD-10-CM | POA: Diagnosis not present

## 2015-06-01 DIAGNOSIS — S72142D Displaced intertrochanteric fracture of left femur, subsequent encounter for closed fracture with routine healing: Secondary | ICD-10-CM | POA: Diagnosis not present

## 2015-06-25 DIAGNOSIS — M85859 Other specified disorders of bone density and structure, unspecified thigh: Secondary | ICD-10-CM | POA: Diagnosis not present

## 2015-06-25 DIAGNOSIS — Z13820 Encounter for screening for osteoporosis: Secondary | ICD-10-CM | POA: Diagnosis not present

## 2015-06-25 DIAGNOSIS — M810 Age-related osteoporosis without current pathological fracture: Secondary | ICD-10-CM | POA: Diagnosis not present

## 2015-06-25 DIAGNOSIS — E2839 Other primary ovarian failure: Secondary | ICD-10-CM | POA: Diagnosis not present

## 2015-06-25 DIAGNOSIS — M85861 Other specified disorders of bone density and structure, right lower leg: Secondary | ICD-10-CM | POA: Diagnosis not present

## 2015-07-02 DIAGNOSIS — Z9181 History of falling: Secondary | ICD-10-CM | POA: Diagnosis not present

## 2015-07-02 DIAGNOSIS — M6281 Muscle weakness (generalized): Secondary | ICD-10-CM | POA: Diagnosis not present

## 2015-07-02 DIAGNOSIS — S72142D Displaced intertrochanteric fracture of left femur, subsequent encounter for closed fracture with routine healing: Secondary | ICD-10-CM | POA: Diagnosis not present

## 2015-07-02 DIAGNOSIS — Z4789 Encounter for other orthopedic aftercare: Secondary | ICD-10-CM | POA: Diagnosis not present

## 2015-07-10 DIAGNOSIS — H2513 Age-related nuclear cataract, bilateral: Secondary | ICD-10-CM | POA: Diagnosis not present

## 2015-07-10 DIAGNOSIS — H4720 Unspecified optic atrophy: Secondary | ICD-10-CM | POA: Diagnosis not present

## 2015-07-15 DIAGNOSIS — Z1389 Encounter for screening for other disorder: Secondary | ICD-10-CM | POA: Diagnosis not present

## 2015-07-15 DIAGNOSIS — S72002A Fracture of unspecified part of neck of left femur, initial encounter for closed fracture: Secondary | ICD-10-CM | POA: Diagnosis not present

## 2015-07-15 DIAGNOSIS — M810 Age-related osteoporosis without current pathological fracture: Secondary | ICD-10-CM | POA: Diagnosis not present

## 2015-07-29 DIAGNOSIS — L299 Pruritus, unspecified: Secondary | ICD-10-CM | POA: Diagnosis not present

## 2015-07-29 DIAGNOSIS — L300 Nummular dermatitis: Secondary | ICD-10-CM | POA: Diagnosis not present

## 2015-07-30 DIAGNOSIS — S72142D Displaced intertrochanteric fracture of left femur, subsequent encounter for closed fracture with routine healing: Secondary | ICD-10-CM | POA: Diagnosis not present

## 2015-07-30 DIAGNOSIS — M6281 Muscle weakness (generalized): Secondary | ICD-10-CM | POA: Diagnosis not present

## 2015-07-30 DIAGNOSIS — Z4789 Encounter for other orthopedic aftercare: Secondary | ICD-10-CM | POA: Diagnosis not present

## 2015-07-30 DIAGNOSIS — Z9181 History of falling: Secondary | ICD-10-CM | POA: Diagnosis not present

## 2015-11-11 DIAGNOSIS — H2513 Age-related nuclear cataract, bilateral: Secondary | ICD-10-CM | POA: Diagnosis not present

## 2015-11-19 DIAGNOSIS — Z6838 Body mass index (BMI) 38.0-38.9, adult: Secondary | ICD-10-CM | POA: Diagnosis not present

## 2015-11-19 DIAGNOSIS — R509 Fever, unspecified: Secondary | ICD-10-CM | POA: Diagnosis not present

## 2015-11-19 DIAGNOSIS — S8011XA Contusion of right lower leg, initial encounter: Secondary | ICD-10-CM | POA: Diagnosis not present

## 2015-11-20 DIAGNOSIS — H2513 Age-related nuclear cataract, bilateral: Secondary | ICD-10-CM | POA: Diagnosis not present

## 2015-11-21 ENCOUNTER — Encounter: Payer: Self-pay | Admitting: *Deleted

## 2015-11-23 NOTE — H&P (Signed)
See scanned note.

## 2015-11-24 ENCOUNTER — Encounter
Admission: RE | Disposition: A | Discharge status: Home / Self Care | Payer: Self-pay | Source: Ambulatory Visit | Attending: Ophthalmology

## 2015-11-24 ENCOUNTER — Ambulatory Visit: Payer: Medicare HMO | Admitting: Anesthesiology

## 2015-11-24 ENCOUNTER — Encounter: Payer: Self-pay | Admitting: Anesthesiology

## 2015-11-24 ENCOUNTER — Ambulatory Visit
Admission: RE | Admit: 2015-11-24 | Discharge: 2015-11-24 | Disposition: A | Discharge status: Home / Self Care | Payer: Medicare HMO | Source: Ambulatory Visit | Attending: Ophthalmology | Admitting: Ophthalmology

## 2015-11-24 DIAGNOSIS — H2511 Age-related nuclear cataract, right eye: Secondary | ICD-10-CM | POA: Insufficient documentation

## 2015-11-24 DIAGNOSIS — R69 Illness, unspecified: Secondary | ICD-10-CM | POA: Diagnosis not present

## 2015-11-24 DIAGNOSIS — D649 Anemia, unspecified: Secondary | ICD-10-CM | POA: Diagnosis not present

## 2015-11-24 DIAGNOSIS — Z862 Personal history of diseases of the blood and blood-forming organs and certain disorders involving the immune mechanism: Secondary | ICD-10-CM | POA: Insufficient documentation

## 2015-11-24 DIAGNOSIS — I10 Essential (primary) hypertension: Secondary | ICD-10-CM | POA: Diagnosis not present

## 2015-11-24 DIAGNOSIS — H2513 Age-related nuclear cataract, bilateral: Secondary | ICD-10-CM | POA: Diagnosis not present

## 2015-11-24 DIAGNOSIS — F17210 Nicotine dependence, cigarettes, uncomplicated: Secondary | ICD-10-CM | POA: Diagnosis not present

## 2015-11-24 HISTORY — DX: Age-related osteoporosis without current pathological fracture: M81.0

## 2015-11-24 HISTORY — PX: CATARACT EXTRACTION W/PHACO: SHX586

## 2015-11-24 SURGERY — PHACOEMULSIFICATION, CATARACT, WITH IOL INSERTION
Anesthesia: Monitor Anesthesia Care | Site: Eye | Laterality: Right | Wound class: Clean

## 2015-11-24 MED ORDER — NA CHONDROIT SULF-NA HYALURON 40-17 MG/ML IO SOLN
INTRAOCULAR | Status: AC
  Filled 2015-11-24: qty 1

## 2015-11-24 MED ORDER — MOXIFLOXACIN HCL 0.5 % OP SOLN
OPHTHALMIC | Status: DC | PRN
  Administered 2015-11-24: 1 [drp] via OPHTHALMIC

## 2015-11-24 MED ORDER — MOXIFLOXACIN HCL 0.5 % OP SOLN
1.0000 [drp] | Freq: Once | OPHTHALMIC | Status: AC
  Administered 2015-11-24 (×3): 1 [drp] via OPHTHALMIC

## 2015-11-24 MED ORDER — CYCLOPENTOLATE HCL 2 % OP SOLN
OPHTHALMIC | Status: AC
  Administered 2015-11-24: 08:00:00
  Filled 2015-11-24: qty 2

## 2015-11-24 MED ORDER — LIDOCAINE HCL (PF) 4 % IJ SOLN
INTRAMUSCULAR | Status: AC
  Filled 2015-11-24: qty 5

## 2015-11-24 MED ORDER — MOXIFLOXACIN HCL 0.5 % OP SOLN
OPHTHALMIC | Status: AC
  Administered 2015-11-24: 08:00:00
  Filled 2015-11-24: qty 3

## 2015-11-24 MED ORDER — POVIDONE-IODINE 5 % OP SOLN
OPHTHALMIC | Status: DC | PRN
  Administered 2015-11-24: 1 via OPHTHALMIC

## 2015-11-24 MED ORDER — MIDAZOLAM HCL 2 MG/2ML IJ SOLN
INTRAMUSCULAR | Status: DC | PRN
  Administered 2015-11-24: 1 mg via INTRAVENOUS

## 2015-11-24 MED ORDER — CYCLOPENTOLATE HCL 2 % OP SOLN
1.0000 [drp] | Freq: Once | OPHTHALMIC | Status: AC
  Administered 2015-11-24 (×4): 1 [drp] via OPHTHALMIC

## 2015-11-24 MED ORDER — EPINEPHRINE HCL 1 MG/ML IJ SOLN
INTRAOCULAR | Status: DC | PRN
  Administered 2015-11-24: 1 mL via OPHTHALMIC

## 2015-11-24 MED ORDER — PHENYLEPHRINE HCL 10 % OP SOLN
1.0000 [drp] | Freq: Once | OPHTHALMIC | Status: AC
  Administered 2015-11-24 (×4): 1 [drp] via OPHTHALMIC

## 2015-11-24 MED ORDER — PHENYLEPHRINE HCL 10 % OP SOLN
OPHTHALMIC | Status: AC
  Administered 2015-11-24: 08:00:00
  Filled 2015-11-24: qty 5

## 2015-11-24 MED ORDER — CEFUROXIME OPHTHALMIC INJECTION 1 MG/0.1 ML
INJECTION | OPHTHALMIC | Status: AC
  Filled 2015-11-24: qty 0.1

## 2015-11-24 MED ORDER — ALFENTANIL 500 MCG/ML IJ INJ
INJECTION | INTRAMUSCULAR | Status: DC | PRN
  Administered 2015-11-24: 500 ug via INTRAVENOUS

## 2015-11-24 MED ORDER — NA CHONDROIT SULF-NA HYALURON 40-17 MG/ML IO SOLN
INTRAOCULAR | Status: DC | PRN
  Administered 2015-11-24: 1 mL via INTRAOCULAR

## 2015-11-24 MED ORDER — TETRACAINE HCL 0.5 % OP SOLN
OPHTHALMIC | Status: AC
  Filled 2015-11-24: qty 2

## 2015-11-24 MED ORDER — CARBACHOL 0.01 % IO SOLN
INTRAOCULAR | Status: DC | PRN
  Administered 2015-11-24: .5 mL via INTRAOCULAR

## 2015-11-24 MED ORDER — TETRACAINE HCL 0.5 % OP SOLN
OPHTHALMIC | Status: DC | PRN
  Administered 2015-11-24: 1 [drp] via OPHTHALMIC

## 2015-11-24 MED ORDER — SODIUM CHLORIDE 0.9 % IV SOLN
INTRAVENOUS | Status: DC
  Administered 2015-11-24: 08:00:00 via INTRAVENOUS

## 2015-11-24 MED ORDER — BUPIVACAINE HCL (PF) 0.75 % IJ SOLN
INTRAMUSCULAR | Status: AC
  Filled 2015-11-24: qty 10

## 2015-11-24 MED ORDER — HYALURONIDASE HUMAN 150 UNIT/ML IJ SOLN
INTRAMUSCULAR | Status: AC
  Filled 2015-11-24: qty 1

## 2015-11-24 MED ORDER — POVIDONE-IODINE 5 % OP SOLN
OPHTHALMIC | Status: AC
Start: 2015-11-24 — End: 2015-11-24
  Filled 2015-11-24: qty 30

## 2015-11-24 MED ORDER — LIDOCAINE HCL (PF) 4 % IJ SOLN
INTRAOCULAR | Status: DC | PRN
  Administered 2015-11-24: .5 mL via OPHTHALMIC

## 2015-11-24 MED ORDER — EPINEPHRINE HCL 1 MG/ML IJ SOLN
INTRAMUSCULAR | Status: AC
  Filled 2015-11-24: qty 2

## 2015-11-24 MED ORDER — LIDOCAINE HCL (PF) 4 % IJ SOLN
INTRAMUSCULAR | Status: DC | PRN
  Administered 2015-11-24: 4 mL via OPHTHALMIC

## 2015-11-24 SURGICAL SUPPLY — 32 items
CANNULA ANT/CHMB 27G (MISCELLANEOUS) ×1 IMPLANT
CANNULA ANT/CHMB 27GA (MISCELLANEOUS) ×2 IMPLANT
CORD BIP STRL DISP 12FT (MISCELLANEOUS) ×2 IMPLANT
CUP MEDICINE 2OZ PLAST GRAD ST (MISCELLANEOUS) ×2 IMPLANT
DRAPE XRAY CASSETTE 23X24 (DRAPES) ×2 IMPLANT
ERASER HMR WETFIELD 18G (MISCELLANEOUS) ×2 IMPLANT
GLOVE BIO SURGEON STRL SZ8 (GLOVE) ×2 IMPLANT
GLOVE SURG LX 6.5 MICRO (GLOVE) ×1
GLOVE SURG LX 8.0 MICRO (GLOVE) ×1
GLOVE SURG LX STRL 6.5 MICRO (GLOVE) ×1 IMPLANT
GLOVE SURG LX STRL 8.0 MICRO (GLOVE) ×1 IMPLANT
GOWN STRL REUS W/ TWL LRG LVL3 (GOWN DISPOSABLE) ×1 IMPLANT
GOWN STRL REUS W/ TWL XL LVL3 (GOWN DISPOSABLE) ×1 IMPLANT
GOWN STRL REUS W/TWL LRG LVL3 (GOWN DISPOSABLE) ×2
GOWN STRL REUS W/TWL XL LVL3 (GOWN DISPOSABLE) ×2
LENS IOL ACRSF IQ ULTRA 23.5 (Intraocular Lens) IMPLANT
LENS IOL ACRYSOF IQ 23.5 (Intraocular Lens) ×2 IMPLANT
PACK CATARACT (MISCELLANEOUS) ×2 IMPLANT
PACK CATARACT DINGLEDEIN LX (MISCELLANEOUS) ×2 IMPLANT
PACK EYE AFTER SURG (MISCELLANEOUS) ×2 IMPLANT
SHLD EYE VISITEC  UNIV (MISCELLANEOUS) ×2 IMPLANT
SOL BSS BAG (MISCELLANEOUS) ×2
SOL PREP PVP 2OZ (MISCELLANEOUS) ×2
SOLUTION BSS BAG (MISCELLANEOUS) ×1 IMPLANT
SOLUTION PREP PVP 2OZ (MISCELLANEOUS) ×1 IMPLANT
SUT ETHILON 10 0 CS140 6 (SUTURE) ×1 IMPLANT
SUT SILK 5-0 (SUTURE) ×2 IMPLANT
SYR 3ML LL SCALE MARK (SYRINGE) ×2 IMPLANT
SYR 5ML LL (SYRINGE) ×2 IMPLANT
SYR TB 1ML 27GX1/2 LL (SYRINGE) ×2 IMPLANT
WATER STERILE IRR 1000ML POUR (IV SOLUTION) ×2 IMPLANT
WIPE NON LINTING 3.25X3.25 (MISCELLANEOUS) ×2 IMPLANT

## 2015-11-24 NOTE — Transfer of Care (Signed)
Immediate Anesthesia Transfer of Care Note  Patient: [name]  Procedure(s) Performed: Procedure(s) with comments: CATARACT EXTRACTION PHACO AND INTRAOCULAR LENS PLACEMENT ([place]) suture placed in right eye at end of procedure (Right) - [place] 02:04 AP% 25.9 CDE 59.08 fluid pack lot # [id number]  Patient Location: PACU  Anesthesia Type:MAC  Level of Consciousness: awake, alert  and oriented  Airway & Oxygen Therapy: Patient Spontanous Breathing  Post-op Assessment: Report given to RN  Post vital signs: stable  Last Vitals:  Filed Vitals:   11/24/15 0833 11/24/15 0952  BP: 135/73 119/61  Pulse: 72 78  Temp: 35.7 C 36.6 C  Resp: 16 10    Last Pain: There were no vitals filed for this visit.       Complications: No apparent anesthesia complications

## 2015-11-24 NOTE — Interval H&P Note (Signed)
History and Physical Interval Note:  11/24/2015 8:58 AM  [name]  has presented today for surgery, with the diagnosis of CATARACT  The various methods of treatment have been discussed with the patient and family. After consideration of risks, benefits and other options for treatment, the patient has consented to  Procedure(s): CATARACT EXTRACTION PHACO AND INTRAOCULAR LENS PLACEMENT ([place]) (Right) as a surgical intervention .  The patient's history has been reviewed, patient examined, no change in status, stable for surgery.  I have reviewed the patient's chart and labs.  Questions were answered to the patient's satisfaction.     Yuri Fana

## 2015-11-24 NOTE — Anesthesia Postprocedure Evaluation (Signed)
Anesthesia Post Note  Patient: [name]  Procedure(s) Performed: Procedure(s) (LRB): CATARACT EXTRACTION PHACO AND INTRAOCULAR LENS PLACEMENT ([place]) suture placed in right eye at end of procedure (Right)  Patient location during evaluation: PACU Anesthesia Type: MAC Level of consciousness: awake and alert and oriented Pain management: pain level controlled Vital Signs Assessment: post-procedure vital signs reviewed and stable Respiratory status: spontaneous breathing Cardiovascular status: stable Anesthetic complications: no    Last Vitals:  Filed Vitals:   11/24/15 0833 11/24/15 0952  BP: 135/73 119/61  Pulse: 72 78  Temp: 35.7 C 36.6 C  Resp: 16 10    Last Pain: There were no vitals filed for this visit.               Estill Batten

## 2015-11-24 NOTE — Op Note (Deleted)
Date of Surgery: 11/24/2015 Date of Dictation: 11/24/2015 9:50 AM Pre-operative Diagnosis:  Nuclear Sclerotic Cataract right Eye Post-operative Diagnosis: same Procedure performed: Extra-capsular Cataract Extraction (ECCE) with placement of a posterior chamber intraocular lens (IOL) right Eye IOL:  Implant Name Type Inv. Item Serial No. Manufacturer Lot No. LRB No. Used  Jearld Lesch      [id number] ALCON   Right 1   Anesthesia: 2% Lidocaine and 4% Marcaine in a 50/50 mixture with 10 unites/ml of Hylenex given as a peribulbar Anesthesiologist: Anesthesiologist: Gunnar Bulla, MD CRNA: Aline Brochure, CRNA Complications: none Estimated Blood Loss: less than 1 ml  Description of procedure:  The patient was given anesthesia and sedation via intravenous access. The patient was then prepped and draped in the usual fashion. A 25-gauge needle was bent for initiating the capsulorhexis. A 5-0 silk suture was placed through the conjunctiva superior and inferiorly to serve as bridle sutures. Hemostasis was obtained at the superior limbus using an eraser cautery. A partial thickness groove was made at the anterior surgical limbus with a 64 Beaver blade and this was dissected anteriorly with an [employer]. The anterior chamber was entered at 10 o'clock with a 1.0 mm paracentesis knife and through the lamellar dissection with a 2.6 mm Alcon keratome. Epi-Shugarcaine 0.5 CC [9 cc BSS Plus (Alcon), 3 cc 4% preservative-free lidocaine ([place]) and 4 cc 1:1000 preservative-free, bisulfite-free epinephrine] was injected into the anterior chamber via the paracentesis tract. Epi-Shugarcaine 0.5 CC [9 cc BSS Plus (Alcon), 3 cc 4% preservative-free lidocaine ([place]ospira) and 4 cc 1:1000 preservative-free, bisulfite-free epinephrine] was injected into the anterior chamber via the paracentesis tract. DiscoVisc was injected to replace the aqueous and a continuous tear curvilinear capsulorhexis was performed using a  bent 25-gauge needle.  Balance salt on a syringe was used to perform hydro-dissection and phacoemulsification was carried out using a divide and conquer technique. Procedure(s) with comments: CATARACT EXTRACTION PHACO AND INTRAOCULAR LENS PLACEMENT ([facility]) suture placed in right eye at end of procedure (Right) - [place] 02:04 AP% 25.9 CDE 59.08 fluid pack lot # [id number]. Irrigation/aspiration was used to remove the residual cortex and the capsular bag was inflated with DiscoVisc. The intraocular lens was inserted into the capsular bag using a pre-loaded UltraSert Delivery System. Irrigation/aspiration was used to remove the residual DiscoVisc. The wound was inflated with balanced salt and checked for leaks. None were found. Miostat was injected via the paracentesis track and 0.1 ml of Vigamox containing 1 mg of drug  was injected via the paracentesis track. The wound was checked for leaks again and none were found.   The bridal sutures were removed and two drops of Vigamox were placed on the eye. An eye shield was placed to protect the eye and the patient was discharged to the recovery area in good condition.   Keelee Yankey MD

## 2015-11-24 NOTE — Anesthesia Preprocedure Evaluation (Signed)
Anesthesia Evaluation  Patient identified by MRN, date of birth, ID band Patient awake    Reviewed: Allergy & Precautions, NPO status , Patient's Chart, lab work & pertinent test results, reviewed documented beta blocker date and time   Airway Mallampati: II  TM Distance: >3 FB     Dental  (+) Chipped   Pulmonary Current Smoker,           Cardiovascular hypertension, Pt. on medications and Pt. on home beta blockers      Neuro/Psych    GI/Hepatic   Endo/Other    Renal/GU      Musculoskeletal   Abdominal   Peds  Hematology  (+) anemia ,   Anesthesia Other Findings   Reproductive/Obstetrics                             Anesthesia Physical Anesthesia Plan  ASA: III  Anesthesia Plan: MAC   Post-op Pain Management:    Induction:   Airway Management Planned:   Additional Equipment:   Intra-op Plan:   Post-operative Plan:   Informed Consent: I have reviewed the patients History and Physical, chart, labs and discussed the procedure including the risks, benefits and alternatives for the proposed anesthesia with the patient or authorized representative who has indicated his/her understanding and acceptance.     Plan Discussed with: CRNA  Anesthesia Plan Comments:         Anesthesia Quick Evaluation

## 2015-11-24 NOTE — Op Note (Signed)
Date of Surgery: 11/24/2015 Date of Dictation: 11/24/2015 9:52 AM Pre-operative Diagnosis:  Nuclear Sclerotic Cataract right Eye Post-operative Diagnosis: same Procedure performed: Extra-capsular Cataract Extraction (ECCE) with placement of a posterior chamber intraocular lens (IOL) right Eye IOL:  Implant Name Type Inv. Item Serial No. Manufacturer Lot No. LRB No. Used  Jearld Lesch      [id number] ALCON   Right 1   Anesthesia: 2% Lidocaine and 4% Marcaine in a 50/50 mixture with 10 unites/ml of Hylenex given as a peribulbar Anesthesiologist: Anesthesiologist: Gunnar Bulla, MD CRNA: Aline Brochure, CRNA Complications: none Estimated Blood Loss: less than 1 ml  Description of procedure:  The patient was given anesthesia and sedation via intravenous access. The patient was then prepped and draped in the usual fashion. A 25-gauge needle was bent for initiating the capsulorhexis. A 5-0 silk suture was placed through the conjunctiva superior and inferiorly to serve as bridle sutures. Hemostasis was obtained at the superior limbus using an eraser cautery. A partial thickness groove was made at the anterior surgical limbus with a 64 Beaver blade and this was dissected anteriorly with an Avaya. The anterior chamber was entered at 10 o'clock with a 1.0 mm paracentesis knife and through the lamellar dissection with a 2.6 mm Alcon keratome. Epi-Shugarcaine 0.5 CC [9 cc BSS Plus (Alcon), 3 cc 4% preservative-free lidocaine (Hospira) and 4 cc 1:1000 preservative-free, bisulfite-free epinephrine] was injected into the anterior chamber via the paracentesis tract. Epi-Shugarcaine 0.5 CC [9 cc BSS Plus (Alcon), 3 cc 4% preservative-free lidocaine (Hospira) and 4 cc 1:1000 preservative-free, bisulfite-free epinephrine] was injected into the anterior chamber via the paracentesis tract. DiscoVisc was injected to replace the aqueous and a continuous tear curvilinear capsulorhexis was performed using a  bent 25-gauge needle.  Balance salt on a syringe was used to perform hydro-dissection and phacoemulsification was carried out using a divide and conquer technique. Procedure(s) with comments: CATARACT EXTRACTION PHACO AND INTRAOCULAR LENS PLACEMENT (IOC) suture placed in right eye at end of procedure (Right) - [place] 02:04 AP% 25.9 CDE 59.08 fluid pack lot # [id number]. Irrigation/aspiration was used to remove the residual cortex and the capsular bag was inflated with DiscoVisc. The intraocular lens was inserted into the capsular bag using a pre-loaded UltraSert Delivery System. Irrigation/aspiration was used to remove the residual DiscoVisc. The wound was inflated with balanced salt and checked for leaks. There was a slow leak and a single 10-0 nylon suture was placed and tied. No additional leaks were found. Miostat was injected via the paracentesis track and 0.1 ml of Vigamox containing 1 mg of drug  was injected via the paracentesis track. The wound was checked for leaks again and none were found.   The bridal sutures were removed and two drops of Vigamox were placed on the eye. An eye shield was placed to protect the eye and the patient was discharged to the recovery area in good condition.   Bartolo Montanye MD

## 2015-11-24 NOTE — Discharge Instructions (Addendum)
See handouit Eye Surgery Discharge Instructions  Expect mild scratchy sensation or mild soreness. DO NOT RUB YOUR EYE!  The day of surgery:  Minimal physical activity, but bed rest is not required  No reading, computer work, or close hand work  No bending, lifting, or straining.  May watch TV  For 24 hours:  No driving, legal decisions, or alcoholic beverages  Safety precautions  Eat anything you prefer: It is better to start with liquids, then soup then solid foods.  _____ Eye patch should be worn until postoperative exam tomorrow.  ____ Solar shield eyeglasses should be worn for comfort in the sunlight/patch while sleeping  Resume all regular medications including aspirin or Coumadin if these were discontinued prior to surgery. You may shower, bathe, shave, or wash your hair. Tylenol may be taken for mild discomfort.  Call your doctor if you experience significant pain, nausea, or vomiting, fever > 101 or other signs of infection. [phone] or [phone] Specific instructions:  Follow-up Information    Follow up with Estill Cotta, MD On 11/25/2015.   Specialty:  Ophthalmology   Why:  9:45   Contact information:   [address]   [place] [address] [phone]

## 2015-11-28 ENCOUNTER — Encounter: Payer: Self-pay | Admitting: Ophthalmology

## 2016-04-15 DIAGNOSIS — R69 Illness, unspecified: Secondary | ICD-10-CM | POA: Diagnosis not present

## 2016-08-10 DIAGNOSIS — H2512 Age-related nuclear cataract, left eye: Secondary | ICD-10-CM | POA: Diagnosis not present

## 2016-08-30 DIAGNOSIS — M810 Age-related osteoporosis without current pathological fracture: Secondary | ICD-10-CM | POA: Diagnosis not present

## 2016-08-30 DIAGNOSIS — Z6829 Body mass index (BMI) 29.0-29.9, adult: Secondary | ICD-10-CM | POA: Diagnosis not present

## 2016-08-30 DIAGNOSIS — E119 Type 2 diabetes mellitus without complications: Secondary | ICD-10-CM | POA: Diagnosis not present

## 2016-08-30 DIAGNOSIS — M25552 Pain in left hip: Secondary | ICD-10-CM | POA: Diagnosis not present

## 2016-08-30 DIAGNOSIS — I10 Essential (primary) hypertension: Secondary | ICD-10-CM | POA: Diagnosis not present

## 2016-08-30 DIAGNOSIS — E663 Overweight: Secondary | ICD-10-CM | POA: Diagnosis not present

## 2016-08-30 DIAGNOSIS — E782 Mixed hyperlipidemia: Secondary | ICD-10-CM | POA: Diagnosis not present

## 2016-09-13 ENCOUNTER — Ambulatory Visit: Payer: Medicare HMO | Attending: Physician Assistant

## 2016-09-13 DIAGNOSIS — M25652 Stiffness of left hip, not elsewhere classified: Secondary | ICD-10-CM | POA: Diagnosis present

## 2016-09-13 DIAGNOSIS — M25552 Pain in left hip: Secondary | ICD-10-CM | POA: Diagnosis present

## 2016-09-13 DIAGNOSIS — M6281 Muscle weakness (generalized): Secondary | ICD-10-CM | POA: Insufficient documentation

## 2016-09-13 NOTE — Therapy (Signed)
[place] MAIN [facility] SERVICES [address] [place], [place], [address] Phone: [phone]   Fax:  [phone]  Physical Therapy Evaluation  Patient Details  Name: [name] MRN: [id number] Date of Birth: June 02, 1940 No Data Recorded  Encounter Date: 09/13/2016      PT End of Session - 09/13/16 1610    Visit Number 1   Number of Visits 12   Date for PT Re-Evaluation 10/25/16   Authorization Type 1 / 10 G code   PT Start Time 1350   PT Stop Time 1445   PT Time Calculation (min) 55 min   Equipment Utilized During Treatment Gait belt   Activity Tolerance Patient tolerated treatment well   Behavior During Therapy WFL for tasks assessed/performed      Past Medical History:  Diagnosis Date  . Hyperlipidemia   . Hypertension   . Osteoporosis     Past Surgical History:  Procedure Laterality Date  . APPENDECTOMY  1954  . CATARACT EXTRACTION W/PHACO Right 11/24/2015   Procedure: CATARACT EXTRACTION PHACO AND INTRAOCULAR LENS PLACEMENT ([place]) suture placed in right eye at end of procedure;  Surgeon: Estill Cotta, MD;  Location: [facility] ORS;  Service: Ophthalmology;  Laterality: Right;  [place] 02:04 AP% 25.9 CDE 59.08 fluid pack lot # [id number]  . INTRAMEDULLARY (IM) NAIL INTERTROCHANTERIC Left 01/04/2015   Procedure: INTRAMEDULLARY (IM) NAIL INTERTROCHANTRIC;  Surgeon: Netta Cedars, MD;  Location: [facility];  Service: Orthopedics;  Laterality: Left;  . JOINT REPLACEMENT      There were no vitals filed for this visit.       Subjective Assessment - 09/13/16 1406    Subjective Patient is a 76 yo presenting with L hip pain with symptoms radiating down the posterior aspect of the leg into the calve. Patient reports increased pain with sitting for long periods of time, standing for prolonged periods of time, and walking for time. Patient reports decreased pain after changing position and NSAIDS. Patient reports the worst pain in a past week was a  7/10 and 0/10 at it's best. Patient reports no issues with sleeping ability. Patient worst in the morning and aggrvated with movement thoughout.    Patient is accompained by: Family member   Pertinent History L hip Fx repaired by ORIF in Aug 2016;    How long can you sit comfortably? 8min   How long can you stand comfortably? 30min   How long can you walk comfortably? 14min   Diagnostic tests X-Ray post fx   Patient Stated Goals Decrease hip pain and return to workout routine   Currently in Pain? Yes   Pain Score 2    Pain Location Hip   Pain Orientation Left   Pain Descriptors / Indicators Aching;Sharp   Pain Type Chronic pain   Pain Onset More than a month ago   Pain Frequency Intermittent            [facility] PT Assessment - 09/13/16 1401      Assessment   Medical Diagnosis L hip pain   Onset Date/Surgical Date 12/30/14   Hand Dominance Left   Next MD Visit unknown   Prior Therapy home health post fx     Precautions   Precautions None     Balance Screen   Has the patient fallen in the past 6 months No   Has the patient had a decrease in activity level because of a fear of falling?  No   Is the patient  reluctant to leave their home because of a fear of falling?  No     Home Environment   Living Environment Private residence   Living Arrangements Spouse/significant other   Available Help at Discharge [place] to enter   Entrance Stairs-Number of Steps [place] One level  Steps dispersed throughout home   [place] bench;Grab bars - tub/shower;Toilet riser     Prior Function   Level of Independence Independent   Vocation Retired   [employer] N/A   Leisure Read, workout     Cognition   Overall Cognitive Status Within Functional Limits for tasks assessed      Observation: Palpation: TTP over the glute med (reproduction of pain) and to the glute max with patient positioned in  sidelying.  MMT L Hip Flexion: 4-/5; extension: 3/5 painful; abd: 3/5 painful; ER: 4-/5 painful; IR: 4-/5 pain R Hip: All WNL  AROM:  L Hip: Flexion: WNL; extension: 10deg painful; abd: 20deg painful; ER: 25deg painful; IR: 30deg pain R Hip: All WNL Lumbar: Flexion: WNL; Extension: 50% from NL; R & L side bending: 25% from NL; R & L rotation: 25%   Special tests:  FADIR: Positive on L FABER: Positive on R SLUMP: Negative on L  TREATMENT: Bridges in hookyling: x10 Sidelying hip abduction: x 10 * Both exercises added to HEP       PT Education - 09/13/16 1609    Education provided Yes   Education Details HEP: bridges, SLR   Person(s) Educated Patient   Methods Explanation;Demonstration   Comprehension Verbalized understanding;Returned demonstration             PT Long Term Goals - 09/13/16 1618      PT LONG TERM GOAL #1   Title Patient will be able to ambulate for 26mins without onset of pain to indicate significant improvement in LE function.   Baseline Able to ambulate 67min before onset of pain   Time 6   Period Weeks   Status New     PT LONG TERM GOAL #2   Title Patient will be independent with HEP focused on improving hip stabilization and strength to continue benefits of therapy after D/C   Baseline Dependent for form/technique   Time 6   Period Weeks   Status New     PT LONG TERM GOAL #3   Title Patient will demonstrate a worst pain of 0/10 in her left hip to demonstrate singificant improvement on the VAS scale.   Baseline 5/10 VAS   Time 6   Period Weeks   Status New               Plan - 09/13/16 1610    Clinical Impression Statement Patient is a 76 yo left hand dominant female presenting with L side hip pain which intermittently radiates down the lateral aspect of the leg into the knee. Patient demonstrates hip dysfunction with possible glute med involvement as indicated by decreased hip mobility, increased TTP, and increased pain with hip  rotation. Patient will benefit from further skilled therapy focused on improving muscular strength, endurance and coordination of the hip musculature to return to prior level of function.    Rehab Potential Good   Clinical Impairments Affecting Rehab Potential (+) highly motivated, family support, few comorbities; (-) Chronicity, previous hip fracture   PT Frequency 2x / week   PT Duration 6 weeks   PT  Treatment/Interventions Therapeutic exercise;Balance training;Therapeutic activities;Gait training;Stair training;Moist Heat;Ultrasound;Functional mobility training;Iontophoresis 4mg /ml Dexamethasone;ADLs/Self Care Home Management;Cryotherapy;Electrical Stimulation;Patient/family education;Neuromuscular re-education;Manual techniques;Dry needling   PT Next Visit Plan Progress hip strengthening   PT Home Exercise Plan Bridges, SLR   Consulted and Agree with Plan of Care Patient;Family member/caregiver      Patient will benefit from skilled therapeutic intervention in order to improve the following deficits and impairments:  Decreased mobility, Decreased coordination, Pain, Abnormal gait, Impaired sensation, Increased fascial restricitons, Increased muscle spasms, Decreased endurance, Decreased range of motion, Decreased strength, Hypomobility, Difficulty walking, Decreased balance  Visit Diagnosis: Muscle weakness (generalized)  Pain in left hip  Stiffness of left hip, not elsewhere classified      G-Codes - 09/28/16 1622    Functional Assessment Tool Used (Outpatient Only) VAS, FABER, FADIR, MMT, AROM, clinical judgement   Functional Limitation Changing and maintaining body position   Changing and Maintaining Body Position Current Status (Z6109) At least 20 percent but less than 40 percent impaired, limited or restricted   Changing and Maintaining Body Position Goal Status (U0454) At least 1 percent but less than 20 percent impaired, limited or restricted       Problem List Patient  Active Problem List   Diagnosis Date Noted  . Acute blood loss anemia 01/09/2015  . Hip fracture ([place]) 01/04/2015  . Hyponatremia 01/04/2015  . HTN (hypertension) 01/04/2015  . Leukocytosis 01/04/2015  . Hip fracture requiring operative repair [id number]) 01/04/2015    Blythe Stanford, PT DPT 09/28/2016, 4:23 PM  [place] MAIN [facility] SERVICES [address] [place], [place], [address] Phone: [phone]   Fax:  [phone]  Name: [name] MRN: [id number] Date of Birth: Nov 25, 1940

## 2016-09-15 ENCOUNTER — Ambulatory Visit: Payer: Medicare HMO

## 2016-09-15 DIAGNOSIS — M6281 Muscle weakness (generalized): Secondary | ICD-10-CM | POA: Diagnosis not present

## 2016-09-15 DIAGNOSIS — M25552 Pain in left hip: Secondary | ICD-10-CM

## 2016-09-15 DIAGNOSIS — M25652 Stiffness of left hip, not elsewhere classified: Secondary | ICD-10-CM

## 2016-09-15 NOTE — Therapy (Signed)
[place] MAIN [facility] SERVICES [address] [place], [place], [address] Phone: [phone]   Fax:  [phone]  Physical Therapy Treatment  Patient Details  Name: [name] MRN: [id number] Date of Birth: November 03, 1940 Referring Provider: Cyndi Bender  Encounter Date: 09/15/2016      PT End of Session - 09/15/16 1333    Visit Number 2   Number of Visits 12   Date for PT Re-Evaluation 10/25/16   Authorization Type 2 / 10 G code   PT Start Time 1301   PT Stop Time 1345   PT Time Calculation (min) 44 min   Equipment Utilized During Treatment Gait belt   Activity Tolerance Patient tolerated treatment well   Behavior During Therapy [facility] for tasks assessed/performed      Past Medical History:  Diagnosis Date  . Hyperlipidemia   . Hypertension   . Osteoporosis     Past Surgical History:  Procedure Laterality Date  . APPENDECTOMY  1954  . CATARACT EXTRACTION W/PHACO Right 11/24/2015   Procedure: CATARACT EXTRACTION PHACO AND INTRAOCULAR LENS PLACEMENT ([place]) suture placed in right eye at end of procedure;  Surgeon: Estill Cotta, MD;  Location: [facility] ORS;  Service: Ophthalmology;  Laterality: Right;  [place] 02:04 AP% 25.9 CDE 59.08 fluid pack lot # [id number]  . INTRAMEDULLARY (IM) NAIL INTERTROCHANTERIC Left 01/04/2015   Procedure: INTRAMEDULLARY (IM) NAIL INTERTROCHANTRIC;  Surgeon: Netta Cedars, MD;  Location: [facility];  Service: Orthopedics;  Laterality: Left;  . JOINT REPLACEMENT      There were no vitals filed for this visit.      Subjective Assessment - 09/15/16 1307    Subjective Patient reports her leg is getting a little better each day. Patient reports nothing new otherwise   Patient is accompained by: Family member   Pertinent History L hip Fx repaired by ORIF in Aug 2016;    How long can you sit comfortably? 68min   How long can you stand comfortably? 1min   How long can you walk comfortably? 5min   Diagnostic tests X-Ray  post fx   Patient Stated Goals Decrease hip pain and return to workout routine   Currently in Pain? No/denies   Pain Onset More than a month ago      TREATMENT: Manual Therapy: STM over the glute med and lateral aspect of the hip with patient in sidelying to decrease increased muscle spasms and pain  Therapeutic Exercise: Standing hip abduction - 2 x 15  Standing hip extension in standing - 2 x 15  Sidelying hip abduction - 2 x 10 Single leg stance with UE support - 3 x 30 sec  Sidelying clamshells - 2 x 10  Mini Squats with UE support - 2 x 10 Monster walks with GTB around knees - 2 x 10  Sit to stands with GTB around knees - 2 x 10  *Patient required verbal cueing to perform exercises with proper technique throughout therapy      PT Education - 09/15/16 1332    Education provided Yes   Education Details Reviewed EXS: bridges, SLR   Person(s) Educated Patient   Methods Explanation;Demonstration   Comprehension Verbalized understanding;Returned demonstration             PT Long Term Goals - 09/13/16 1618      PT LONG TERM GOAL #1   Title Patient will be able to ambulate for 1mins without onset of pain to indicate significant improvement in LE function.  Baseline Able to ambulate 99min before onset of pain   Time 6   Period Weeks   Status New     PT LONG TERM GOAL #2   Title Patient will be independent with HEP focused on improving hip stabilization and strength to continue benefits of therapy after D/C   Baseline Dependent for form/technique   Time 6   Period Weeks   Status New     PT LONG TERM GOAL #3   Title Patient will demonstrate a worst pain of 0/10 in her left hip to demonstrate singificant improvement on the VAS scale.   Baseline 5/10 VAS   Time 6   Period Weeks   Status New               Plan - 09/15/16 1336    Clinical Impression Statement Patient demonstrates decreased muscular coordination and endurance when performing standing  indicating by increased fatigue at end of session. Patient demonstrates improvement with hip abduction exercise and will beenfit from further skilled therapy to return to prior level of function.    Rehab Potential Good   Clinical Impairments Affecting Rehab Potential (+) highly motivated, family support, few comorbities; (-) Chronicity, previous hip fracture   PT Frequency 2x / week   PT Duration 6 weeks   PT Treatment/Interventions Therapeutic exercise;Balance training;Therapeutic activities;Gait training;Stair training;Moist Heat;Ultrasound;Functional mobility training;Iontophoresis 4mg /ml Dexamethasone;ADLs/Self Care Home Management;Cryotherapy;Electrical Stimulation;Patient/family education;Neuromuscular re-education;Manual techniques;Dry needling   PT Next Visit Plan Progress hip strengthening   PT Home Exercise Plan Bridges, SLR   Consulted and Agree with Plan of Care Patient;Family member/caregiver      Patient will benefit from skilled therapeutic intervention in order to improve the following deficits and impairments:  Decreased mobility, Decreased coordination, Pain, Abnormal gait, Impaired sensation, Increased fascial restricitons, Increased muscle spasms, Decreased endurance, Decreased range of motion, Decreased strength, Hypomobility, Difficulty walking, Decreased balance  Visit Diagnosis: Muscle weakness (generalized)  Pain in left hip  Stiffness of left hip, not elsewhere classified     Problem List Patient Active Problem List   Diagnosis Date Noted  . Acute blood loss anemia 01/09/2015  . Hip fracture ([place]) 01/04/2015  . Hyponatremia 01/04/2015  . HTN (hypertension) 01/04/2015  . Leukocytosis 01/04/2015  . Hip fracture requiring operative repair [facility]) 01/04/2015    Blythe Stanford, PT DPT 09/15/2016, 3:04 PM  [place] MAIN [facility] SERVICES [address] [place], [place], [address] Phone: [phone]   Fax:   [phone]  Name: [name] MRN: [id number] Date of Birth: 05-Feb-1941

## 2016-09-23 ENCOUNTER — Ambulatory Visit: Payer: Medicare HMO

## 2016-09-23 DIAGNOSIS — M6281 Muscle weakness (generalized): Secondary | ICD-10-CM | POA: Diagnosis not present

## 2016-09-23 DIAGNOSIS — M25552 Pain in left hip: Secondary | ICD-10-CM

## 2016-09-23 DIAGNOSIS — M25652 Stiffness of left hip, not elsewhere classified: Secondary | ICD-10-CM

## 2016-09-23 NOTE — Therapy (Signed)
[place] MAIN [facility] SERVICES [address] [place], [place], [address] Phone: [phone]   Fax:  [phone]  Physical Therapy Treatment  Patient Details  Name: [name] MRN: [id number] Date of Birth: [birth year] Referring Provider: Cyndi Bender  Encounter Date: 09/23/2016      PT End of Session - 09/25/16 1518    Visit Number 3   Number of Visits 12   Date for PT Re-Evaluation 10/25/16   Authorization Type 3 / 10 G code   PT Start Time 7371   PT Stop Time 1600   PT Time Calculation (min) 43 min   Equipment Utilized During Treatment Gait belt   Activity Tolerance Patient tolerated treatment well   Behavior During Therapy [facility] for tasks assessed/performed      Past Medical History:  Diagnosis Date  . Hyperlipidemia   . Hypertension   . Osteoporosis     Past Surgical History:  Procedure Laterality Date  . APPENDECTOMY  1954  . CATARACT EXTRACTION W/PHACO Right 11/24/2015   Procedure: CATARACT EXTRACTION PHACO AND INTRAOCULAR LENS PLACEMENT ([place]) suture placed in right eye at end of procedure;  Surgeon: Estill Cotta, MD;  Location: [facility] ORS;  Service: Ophthalmology;  Laterality: Right;  [place] 02:04 AP% 25.9 CDE 59.08 fluid pack lot # [id number]  . INTRAMEDULLARY (IM) NAIL INTERTROCHANTERIC Left 01/04/2015   Procedure: INTRAMEDULLARY (IM) NAIL INTERTROCHANTRIC;  Surgeon: Netta Cedars, MD;  Location: [facility] ORS;  Service: Orthopedics;  Laterality: Left;  . JOINT REPLACEMENT      There were no vitals filed for this visit.      Subjective Assessment - 09/25/16 1517    Subjective Pt reports some LLE fatigue today from extended walking. She denies pain. HEP is going well. No specific questions or concerns at this time.    Patient is accompained by: Family member   Pertinent History L hip Fx repaired by ORIF in Aug 2016;    How long can you sit comfortably? 53min   How long can you stand comfortably? 16min   How long can you walk  comfortably? 11min   Diagnostic tests X-Ray post fx   Patient Stated Goals Decrease hip pain and return to workout routine   Currently in Pain? No/denies              TREATMENT:  Therapeutic Exercise: NuStep L2 x 4 minutes for warm-up during history; Hooklying L SLR 2 x 10; Hooklying bridges 2 x 10; R sidelying L hip abduction 2 x 10; Standing hip abduction 2 x 10; Standing hip extension 2 x 10; L single leg stance on Airex without UE support 30s x 3; L forward lunge onto BOSU, round side up 2 x 10; L lateral lunge onto BOSU, round side up 2 x 10; Heel raises x 20; HEP consolidated and written program provided  *Patient required verbal cueing to perform exercises with proper technique throughout therapy                   PT Education - 09/25/16 1517    Education Details HEP reinforced and consolidated   Person(s) Educated Patient   Methods Explanation;Demonstration;Verbal cues;Handout   Comprehension Verbalized understanding             PT Long Term Goals - 09/13/16 1618      PT LONG TERM GOAL #1   Title Patient will be able to ambulate for 10mins without onset of pain to indicate significant improvement in LE function.  Baseline Able to ambulate 60min before onset of pain   Time 6   Period Weeks   Status New     PT LONG TERM GOAL #2   Title Patient will be independent with HEP focused on improving hip stabilization and strength to continue benefits of therapy after D/C   Baseline Dependent for form/technique   Time 6   Period Weeks   Status New     PT LONG TERM GOAL #3   Title Patient will demonstrate a worst pain of 0/10 in her left hip to demonstrate singificant improvement on the VAS scale.   Baseline 5/10 VAS   Time 6   Period Weeks   Status New               Plan - 09/25/16 1518    Clinical Impression Statement Pt demonstrates good progress with therapy on this date. She reports some increased LLE fatigue today but  denies pain during session. Pt provided consolidated HEP with education about how to complete correctly. Encouraged to follow-up as scheduled.    Rehab Potential Good   Clinical Impairments Affecting Rehab Potential (+) highly motivated, family support, few comorbities; (-) Chronicity, previous hip fracture   PT Frequency 2x / week   PT Duration 6 weeks   PT Treatment/Interventions Therapeutic exercise;Balance training;Therapeutic activities;Gait training;Stair training;Moist Heat;Ultrasound;Functional mobility training;Iontophoresis 4mg /ml Dexamethasone;ADLs/Self Care Home Management;Cryotherapy;Electrical Stimulation;Patient/family education;Neuromuscular re-education;Manual techniques;Dry needling   PT Next Visit Plan Progress hip strengthening   PT Home Exercise Plan Standing resisted hip abduction, extension, and flexion; bridges; mini squats   Consulted and Agree with Plan of Care Patient;Family member/caregiver      Patient will benefit from skilled therapeutic intervention in order to improve the following deficits and impairments:  Decreased mobility, Decreased coordination, Pain, Abnormal gait, Impaired sensation, Increased fascial restricitons, Increased muscle spasms, Decreased endurance, Decreased range of motion, Decreased strength, Hypomobility, Difficulty walking, Decreased balance  Visit Diagnosis: Muscle weakness (generalized)  Pain in left hip  Stiffness of left hip, not elsewhere classified     Problem List Patient Active Problem List   Diagnosis Date Noted  . Acute blood loss anemia 01/09/2015  . Hip fracture ([place]) 01/04/2015  . Hyponatremia 01/04/2015  . HTN (hypertension) 01/04/2015  . Leukocytosis 01/04/2015  . Hip fracture requiring operative repair [facility]) 01/04/2015   Phillips Grout PT, DPT   Huprich,Jason 09/25/2016, 3:20 PM  [place] MAIN [facility] SERVICES [address] [place], [place], [address] Phone:  [phone]   Fax:  [phone]  Name: [name] MRN: [id number] Date of Birth: [birth year]

## 2016-09-28 ENCOUNTER — Ambulatory Visit: Payer: Medicare HMO | Attending: Physician Assistant

## 2016-09-28 VITALS — BP 156/89 | HR 73

## 2016-09-28 DIAGNOSIS — M25552 Pain in left hip: Secondary | ICD-10-CM | POA: Diagnosis not present

## 2016-09-28 DIAGNOSIS — M6281 Muscle weakness (generalized): Secondary | ICD-10-CM | POA: Diagnosis not present

## 2016-09-28 NOTE — Therapy (Signed)
[place] MAIN [facility] SERVICES [address] [place], [place], [address] Phone: [phone]   Fax:  [phone]  Physical Therapy Treatment  Patient Details  Name: [name] MRN: [id number] Date of Birth: [birth year] Referring Provider: Cyndi Bender  Encounter Date: 09/28/2016      PT End of Session - 09/28/16 1118    Visit Number 4   Number of Visits 12   Date for PT Re-Evaluation 10/25/16   Authorization Type 4 / 10 G code   PT Start Time 1115   PT Stop Time 1200   PT Time Calculation (min) 45 min   Equipment Utilized During Treatment Gait belt   Activity Tolerance Patient tolerated treatment well   Behavior During Therapy [facility] for tasks assessed/performed      Past Medical History:  Diagnosis Date  . Hyperlipidemia   . Hypertension   . Osteoporosis     Past Surgical History:  Procedure Laterality Date  . APPENDECTOMY  1954  . CATARACT EXTRACTION W/PHACO Right 11/24/2015   Procedure: CATARACT EXTRACTION PHACO AND INTRAOCULAR LENS PLACEMENT ([place]) suture placed in right eye at end of procedure;  Surgeon: Estill Cotta, MD;  Location: [facility] ORS;  Service: Ophthalmology;  Laterality: Right;  [place] 02:04 AP% 25.9 CDE 59.08 fluid pack lot # [id number]  . INTRAMEDULLARY (IM) NAIL INTERTROCHANTERIC Left 01/04/2015   Procedure: INTRAMEDULLARY (IM) NAIL INTERTROCHANTRIC;  Surgeon: Netta Cedars, MD;  Location: [facility];  Service: Orthopedics;  Laterality: Left;  . JOINT REPLACEMENT      Vitals:   09/28/16 1118  BP: (!) 156/89  Pulse: 73  SpO2: 98%        Subjective Assessment - 09/28/16 1117    Subjective Pt reports her legs are tired but denies pain today. Denies L hip pain currently. HEP is going well. No specific questions or concerns at this time.    Patient is accompained by: Family member   Pertinent History L hip Fx repaired by ORIF in Aug 2016;    How long can you sit comfortably? 61min   How long can you stand comfortably?  49min   How long can you walk comfortably? 76min   Diagnostic tests X-Ray post fx   Patient Stated Goals Decrease hip pain and return to workout routine   Currently in Pain? No/denies            Therapeutic Exercise: Leg press 105# x 20, 120# x 20, 135# x 20; Standing hip abduction with red tband 2 x 20; Standing hip extension with red tband 2 x 20; Standing hip flexion with red tband 2 x 20;  Standing squats with extensive cues for proper form/technique 2 x 10; L forward lunge onto BOSU, round side up x 10; L lateral step-up to 5" step 2 x 10; L single leg heel raises x 15, x 10; L single leg stance on Airex without UE support 30s x 3;  *Patient required verbal cueing to perform exercises with proper technique throughout therapy                      PT Education - 09/28/16 1118    Education provided Yes   Education Details HEP reinforced   Person(s) Educated Patient   Methods Explanation   Comprehension Verbalized understanding             PT Long Term Goals - 09/13/16 1618      PT LONG TERM GOAL #1   Title  Patient will be able to ambulate for 48mins without onset of pain to indicate significant improvement in LE function.   Baseline Able to ambulate 44min before onset of pain   Time 6   Period Weeks   Status New     PT LONG TERM GOAL #2   Title Patient will be independent with HEP focused on improving hip stabilization and strength to continue benefits of therapy after D/C   Baseline Dependent for form/technique   Time 6   Period Weeks   Status New     PT LONG TERM GOAL #3   Title Patient will demonstrate a worst pain of 0/10 in her left hip to demonstrate singificant improvement on the VAS scale.   Baseline 5/10 VAS   Time 6   Period Weeks   Status New               Plan - 09/28/16 1119    Clinical Impression Statement Pt becomes intermittently frustrated today during session due to lack of confidence in her LLE  strength/balance. She is able to complete all exercises with the exception of lateral BOSU lunges due to frustration. Pt encouraged to continue HEP and follow-up as scheduled. Will continue progressing strength and balance at next session. No L hip pain reported at this time.    Rehab Potential Good   Clinical Impairments Affecting Rehab Potential (+) highly motivated, family support, few comorbities; (-) Chronicity, previous hip fracture   PT Frequency 2x / week   PT Duration 6 weeks   PT Treatment/Interventions Therapeutic exercise;Balance training;Therapeutic activities;Gait training;Stair training;Moist Heat;Ultrasound;Functional mobility training;Iontophoresis 4mg /ml Dexamethasone;ADLs/Self Care Home Management;Cryotherapy;Electrical Stimulation;Patient/family education;Neuromuscular re-education;Manual techniques;Dry needling   PT Next Visit Plan Progress hip strengthening   PT Home Exercise Plan Standing resisted hip abduction, extension, and flexion; bridges; mini squats   Consulted and Agree with Plan of Care Patient;Family member/caregiver      Patient will benefit from skilled therapeutic intervention in order to improve the following deficits and impairments:  Decreased mobility, Decreased coordination, Pain, Abnormal gait, Impaired sensation, Increased fascial restricitons, Increased muscle spasms, Decreased endurance, Decreased range of motion, Decreased strength, Hypomobility, Difficulty walking, Decreased balance  Visit Diagnosis: Muscle weakness (generalized)  Pain in left hip     Problem List Patient Active Problem List   Diagnosis Date Noted  . Acute blood loss anemia 01/09/2015  . Hip fracture ([place]) 01/04/2015  . Hyponatremia 01/04/2015  . HTN (hypertension) 01/04/2015  . Leukocytosis 01/04/2015  . Hip fracture requiring operative repair [facility]) 01/04/2015   Phillips Grout PT, DPT   Huprich,Jason 09/28/2016, 12:56 PM  [place]  MAIN [facility] SERVICES [address] [place], [place], [address] Phone: [phone]   Fax:  [phone]  Name: ZELLIE JENNING MRN: [id number] Date of Birth: 1941/01/20

## 2016-10-04 ENCOUNTER — Ambulatory Visit: Payer: Medicare HMO

## 2016-10-06 ENCOUNTER — Ambulatory Visit: Payer: Medicare HMO

## 2016-10-11 ENCOUNTER — Ambulatory Visit: Payer: Medicare HMO

## 2016-12-30 DIAGNOSIS — E785 Hyperlipidemia, unspecified: Secondary | ICD-10-CM | POA: Diagnosis not present

## 2016-12-30 DIAGNOSIS — Z136 Encounter for screening for cardiovascular disorders: Secondary | ICD-10-CM | POA: Diagnosis not present

## 2016-12-30 DIAGNOSIS — Z9181 History of falling: Secondary | ICD-10-CM | POA: Diagnosis not present

## 2016-12-30 DIAGNOSIS — Z0000 Encounter for general adult medical examination without abnormal findings: Secondary | ICD-10-CM | POA: Diagnosis not present

## 2016-12-30 DIAGNOSIS — Z1389 Encounter for screening for other disorder: Secondary | ICD-10-CM | POA: Diagnosis not present

## 2016-12-30 DIAGNOSIS — Z23 Encounter for immunization: Secondary | ICD-10-CM | POA: Diagnosis not present

## 2017-03-04 DIAGNOSIS — Z720 Tobacco use: Secondary | ICD-10-CM | POA: Diagnosis not present

## 2017-03-04 DIAGNOSIS — R7303 Prediabetes: Secondary | ICD-10-CM | POA: Diagnosis not present

## 2017-03-04 DIAGNOSIS — Z23 Encounter for immunization: Secondary | ICD-10-CM | POA: Diagnosis not present

## 2017-03-04 DIAGNOSIS — E782 Mixed hyperlipidemia: Secondary | ICD-10-CM | POA: Diagnosis not present

## 2017-03-04 DIAGNOSIS — I10 Essential (primary) hypertension: Secondary | ICD-10-CM | POA: Diagnosis not present

## 2017-03-04 DIAGNOSIS — M810 Age-related osteoporosis without current pathological fracture: Secondary | ICD-10-CM | POA: Diagnosis not present

## 2017-03-04 DIAGNOSIS — Z6830 Body mass index (BMI) 30.0-30.9, adult: Secondary | ICD-10-CM | POA: Diagnosis not present

## 2017-09-06 DIAGNOSIS — Z1331 Encounter for screening for depression: Secondary | ICD-10-CM | POA: Diagnosis not present

## 2017-09-06 DIAGNOSIS — R7303 Prediabetes: Secondary | ICD-10-CM | POA: Diagnosis not present

## 2017-09-06 DIAGNOSIS — M810 Age-related osteoporosis without current pathological fracture: Secondary | ICD-10-CM | POA: Diagnosis not present

## 2017-09-06 DIAGNOSIS — I10 Essential (primary) hypertension: Secondary | ICD-10-CM | POA: Diagnosis not present

## 2017-09-06 DIAGNOSIS — E782 Mixed hyperlipidemia: Secondary | ICD-10-CM | POA: Diagnosis not present

## 2017-09-06 DIAGNOSIS — Z6830 Body mass index (BMI) 30.0-30.9, adult: Secondary | ICD-10-CM | POA: Diagnosis not present

## 2018-03-08 DIAGNOSIS — Z1339 Encounter for screening examination for other mental health and behavioral disorders: Secondary | ICD-10-CM | POA: Diagnosis not present

## 2018-03-08 DIAGNOSIS — Z87891 Personal history of nicotine dependence: Secondary | ICD-10-CM | POA: Diagnosis not present

## 2018-03-08 DIAGNOSIS — R7303 Prediabetes: Secondary | ICD-10-CM | POA: Diagnosis not present

## 2018-03-08 DIAGNOSIS — Z9181 History of falling: Secondary | ICD-10-CM | POA: Diagnosis not present

## 2018-03-08 DIAGNOSIS — M810 Age-related osteoporosis without current pathological fracture: Secondary | ICD-10-CM | POA: Diagnosis not present

## 2018-03-08 DIAGNOSIS — Z6830 Body mass index (BMI) 30.0-30.9, adult: Secondary | ICD-10-CM | POA: Diagnosis not present

## 2018-03-08 DIAGNOSIS — M79604 Pain in right leg: Secondary | ICD-10-CM | POA: Diagnosis not present

## 2018-03-08 DIAGNOSIS — I10 Essential (primary) hypertension: Secondary | ICD-10-CM | POA: Diagnosis not present

## 2018-03-08 DIAGNOSIS — E782 Mixed hyperlipidemia: Secondary | ICD-10-CM | POA: Diagnosis not present

## 2018-03-27 ENCOUNTER — Encounter: Payer: Self-pay | Admitting: Gastroenterology

## 2018-06-09 DIAGNOSIS — R69 Illness, unspecified: Secondary | ICD-10-CM | POA: Diagnosis not present

## 2018-07-10 DIAGNOSIS — R69 Illness, unspecified: Secondary | ICD-10-CM | POA: Diagnosis not present

## 2018-08-31 DIAGNOSIS — E782 Mixed hyperlipidemia: Secondary | ICD-10-CM | POA: Diagnosis not present

## 2018-08-31 DIAGNOSIS — I10 Essential (primary) hypertension: Secondary | ICD-10-CM | POA: Diagnosis not present

## 2018-08-31 DIAGNOSIS — R7303 Prediabetes: Secondary | ICD-10-CM | POA: Diagnosis not present

## 2018-08-31 DIAGNOSIS — M810 Age-related osteoporosis without current pathological fracture: Secondary | ICD-10-CM | POA: Diagnosis not present

## 2018-12-12 DIAGNOSIS — R7303 Prediabetes: Secondary | ICD-10-CM | POA: Diagnosis not present

## 2018-12-12 DIAGNOSIS — I10 Essential (primary) hypertension: Secondary | ICD-10-CM | POA: Diagnosis not present

## 2018-12-12 DIAGNOSIS — Z1331 Encounter for screening for depression: Secondary | ICD-10-CM | POA: Diagnosis not present

## 2018-12-12 DIAGNOSIS — Z86010 Personal history of colonic polyps: Secondary | ICD-10-CM | POA: Diagnosis not present

## 2018-12-12 DIAGNOSIS — E782 Mixed hyperlipidemia: Secondary | ICD-10-CM | POA: Diagnosis not present

## 2018-12-12 DIAGNOSIS — Z6829 Body mass index (BMI) 29.0-29.9, adult: Secondary | ICD-10-CM | POA: Diagnosis not present

## 2018-12-12 DIAGNOSIS — Z139 Encounter for screening, unspecified: Secondary | ICD-10-CM | POA: Diagnosis not present

## 2018-12-12 DIAGNOSIS — M810 Age-related osteoporosis without current pathological fracture: Secondary | ICD-10-CM | POA: Diagnosis not present

## 2018-12-12 DIAGNOSIS — R69 Illness, unspecified: Secondary | ICD-10-CM | POA: Diagnosis not present

## 2018-12-12 DIAGNOSIS — Z23 Encounter for immunization: Secondary | ICD-10-CM | POA: Diagnosis not present

## 2019-01-09 DIAGNOSIS — R69 Illness, unspecified: Secondary | ICD-10-CM | POA: Diagnosis not present

## 2019-02-21 DIAGNOSIS — R69 Illness, unspecified: Secondary | ICD-10-CM | POA: Diagnosis not present

## 2019-07-03 DIAGNOSIS — E1142 Type 2 diabetes mellitus with diabetic polyneuropathy: Secondary | ICD-10-CM | POA: Diagnosis not present

## 2019-07-03 DIAGNOSIS — E559 Vitamin D deficiency, unspecified: Secondary | ICD-10-CM | POA: Diagnosis not present

## 2019-07-03 DIAGNOSIS — R69 Illness, unspecified: Secondary | ICD-10-CM | POA: Diagnosis not present

## 2019-09-18 DIAGNOSIS — M25561 Pain in right knee: Secondary | ICD-10-CM | POA: Diagnosis not present

## 2019-09-18 DIAGNOSIS — M25562 Pain in left knee: Secondary | ICD-10-CM | POA: Diagnosis not present

## 2019-10-18 ENCOUNTER — Encounter: Payer: Self-pay | Admitting: Gastroenterology

## 2019-10-31 DIAGNOSIS — R7303 Prediabetes: Secondary | ICD-10-CM | POA: Diagnosis not present

## 2019-10-31 DIAGNOSIS — R69 Illness, unspecified: Secondary | ICD-10-CM | POA: Diagnosis not present

## 2019-10-31 DIAGNOSIS — Z6831 Body mass index (BMI) 31.0-31.9, adult: Secondary | ICD-10-CM | POA: Diagnosis not present

## 2019-10-31 DIAGNOSIS — Z9181 History of falling: Secondary | ICD-10-CM | POA: Diagnosis not present

## 2019-10-31 DIAGNOSIS — M810 Age-related osteoporosis without current pathological fracture: Secondary | ICD-10-CM | POA: Diagnosis not present

## 2019-10-31 DIAGNOSIS — E782 Mixed hyperlipidemia: Secondary | ICD-10-CM | POA: Diagnosis not present

## 2019-10-31 DIAGNOSIS — I10 Essential (primary) hypertension: Secondary | ICD-10-CM | POA: Diagnosis not present

## 2019-11-09 DIAGNOSIS — M810 Age-related osteoporosis without current pathological fracture: Secondary | ICD-10-CM | POA: Diagnosis not present

## 2019-11-27 ENCOUNTER — Other Ambulatory Visit: Payer: Self-pay

## 2019-11-27 ENCOUNTER — Ambulatory Visit (AMBULATORY_SURGERY_CENTER): Payer: Self-pay

## 2019-11-27 VITALS — Ht 62.0 in | Wt 157.6 lb

## 2019-11-27 DIAGNOSIS — Z86010 Personal history of colonic polyps: Secondary | ICD-10-CM

## 2019-11-27 MED ORDER — SUTAB 1479-225-188 MG PO TABS: 12.0000 | ORAL_TABLET | ORAL | 0 refills | Status: DC

## 2019-11-27 NOTE — Progress Notes (Signed)
No allergies to soy or egg Pt is not on blood thinners or diet pills Denies issues with sedation/intubation Denies atrial flutter/fib Denies constipation   Pt is aware of Covid safety and care partner requirements.      

## 2019-12-11 ENCOUNTER — Ambulatory Visit (AMBULATORY_SURGERY_CENTER): Payer: Medicare HMO | Admitting: Gastroenterology

## 2019-12-11 ENCOUNTER — Other Ambulatory Visit: Payer: Self-pay

## 2019-12-11 ENCOUNTER — Encounter: Payer: Self-pay | Admitting: Gastroenterology

## 2019-12-11 VITALS — BP 137/71 | HR 69 | Temp 96.4°F | Resp 24 | Ht 62.0 in | Wt 157.6 lb

## 2019-12-11 DIAGNOSIS — Z86010 Personal history of colonic polyps: Secondary | ICD-10-CM | POA: Diagnosis not present

## 2019-12-11 DIAGNOSIS — D123 Benign neoplasm of transverse colon: Secondary | ICD-10-CM | POA: Diagnosis not present

## 2019-12-11 DIAGNOSIS — D122 Benign neoplasm of ascending colon: Secondary | ICD-10-CM

## 2019-12-11 DIAGNOSIS — Z800 Family history of malignant neoplasm of digestive organs: Secondary | ICD-10-CM

## 2019-12-11 MED ORDER — SODIUM CHLORIDE 0.9 % IV SOLN: 500.0000 mL | Freq: Once | INTRAVENOUS | Status: DC

## 2019-12-11 NOTE — Progress Notes (Signed)
pt tolerated well. VSS. awake and to recovery. Report given to RN.  

## 2019-12-11 NOTE — Progress Notes (Signed)
Called to room to assist during endoscopic procedure.  Patient ID and intended procedure confirmed with present staff. Received instructions for my participation in the procedure from the performing physician.  

## 2019-12-11 NOTE — Patient Instructions (Addendum)
Handouts were given to you on polyps, diverticulosis, and hemorrhoids. You may resume your current medications today. Await biopsy results. Repeat colonoscopy at the next available appointment because the bowel preparation was suboptimal.  Pt and her husband decided to wait 4-6 months to repeat colonoscopy.  We put in a recall colonoscopy with extended prep for 4 months.  Dr. Silverio Decamp is aware of this.   Please call if any questions or concerns.     YOU HAD AN ENDOSCOPIC PROCEDURE TODAY AT [facility]:   Refer to the procedure report that was given to you for any specific questions about what was found during the examination.  If the procedure report does not answer your questions, please call your gastroenterologist to clarify.  If you requested that your care partner not be given the details of your procedure findings, then the procedure report has been included in a sealed envelope for you to review at your convenience later.  YOU SHOULD EXPECT: Some feelings of bloating in the abdomen. Passage of more gas than usual.  Walking can help get rid of the air that was put into your GI tract during the procedure and reduce the bloating. If you had a lower endoscopy (such as a colonoscopy or flexible sigmoidoscopy) you may notice spotting of blood in your stool or on the toilet paper. If you underwent a bowel prep for your procedure, you may not have a normal bowel movement for a few days.  Please Note:  You might notice some irritation and congestion in your nose or some drainage.  This is from the oxygen used during your procedure.  There is no need for concern and it should clear up in a day or so.  SYMPTOMS TO REPORT IMMEDIATELY:   Following lower endoscopy (colonoscopy or flexible sigmoidoscopy):  Excessive amounts of blood in the stool  Significant tenderness or worsening of abdominal pains  Swelling of the abdomen that is new, acute  Fever of 100F or higher    For  urgent or emergent issues, a gastroenterologist can be reached at any hour by calling [phone]. Do not use MyChart messaging for urgent concerns.    DIET:  We do recommend a small meal at first, but then you may proceed to your regular diet.  Drink plenty of fluids but you should avoid alcoholic beverages for 24 hours.  ACTIVITY:  You should plan to take it easy for the rest of today and you should NOT DRIVE or use heavy machinery until tomorrow (because of the sedation medicines used during the test).    FOLLOW UP: Our staff will call the number listed on your records 48-72 hours following your procedure to check on you and address any questions or concerns that you may have regarding the information given to you following your procedure. If we do not reach you, we will leave a message.  We will attempt to reach you two times.  During this call, we will ask if you have developed any symptoms of COVID 19. If you develop any symptoms (ie: fever, flu-like symptoms, shortness of breath, cough etc.) before then, please call [phone].  If you test positive for Covid 19 in the 2 weeks post procedure, please call and report this information to [place].    If any biopsies were taken you will be contacted by phone or by letter within the next 1-3 weeks.  Please call us at [phone] if you have not heard about the biopsies in  3 weeks.    SIGNATURES/CONFIDENTIALITY: You and/or your care partner have signed paperwork which will be entered into your electronic medical record.  These signatures attest to the fact that that the information above on your After Visit Summary has been reviewed and is understood.  Full responsibility of the confidentiality of this discharge information lies with you and/or your care-partner.

## 2019-12-11 NOTE — Op Note (Signed)
[place] Patient Name: [name] Procedure Date: 12/11/2019 1:11 PM MRN: [id number] Endoscopist: Mauri Pole , MD Age: 79 Referring MD:  Date of Birth: 1940-10-09 Gender: Female Account #: [id number] Procedure:                Colonoscopy Indications:              Screening in patient at increased risk: Family                            history of 1st-degree relative with colorectal                            cancer, High risk colon cancer surveillance:                            Personal history of colonic polyps, High risk colon                            cancer surveillance: Personal history of adenoma                            (10 mm or greater in size), High risk colon cancer                            surveillance: Personal history of multiple (3 or                            more) adenomas Medicines:                Monitored Anesthesia Care Procedure:                Pre-Anesthesia Assessment:                           - Prior to the procedure, a History and Physical                            was performed, and patient medications and                            allergies were reviewed. The patient's tolerance of                            previous anesthesia was also reviewed. The risks                            and benefits of the procedure and the sedation                            options and risks were discussed with the patient.                            All questions were answered, and informed consent  was obtained. Prior Anticoagulants: The patient has                            taken no previous anticoagulant or antiplatelet                            agents. ASA Grade Assessment: II - A patient with                            mild systemic disease. After reviewing the risks                            and benefits, the patient was deemed in                            satisfactory condition to undergo the procedure.                            After obtaining informed consent, the colonoscope                            was passed under direct vision. Throughout the                            procedure, the patient's blood pressure, pulse, and                            oxygen saturations were monitored continuously. The                            Colonoscope was introduced through the anus and                            advanced to the the cecum, identified by                            appendiceal orifice and ileocecal valve. The                            colonoscopy was performed without difficulty. The                            patient tolerated the procedure well. The quality                            of the bowel preparation was not adequate to                            identify polyps 6 mm and larger in size and 50                            percent obscured. The ileocecal valve, appendiceal  orifice, and rectum were photographed. Scope In: 1:38:04 PM Scope Out: 1:55:37 PM Scope Withdrawal Time: 0 hours 13 minutes 4 seconds  Total Procedure Duration: 0 hours 17 minutes 33 seconds  Findings:                 The perianal and digital rectal examinations were                            normal.                           A 5 mm polyp was found in the ascending colon. The                            polyp was sessile. The polyp was removed with a                            cold snare. Resection and retrieval were complete.                           Four semi-pedunculated polyps were found in the                            transverse colon and ascending colon. The polyps                            were 7 to 15 mm in size. These polyps were removed                            with a hot snare. Resection and retrieval were                            complete.                           Multiple small and large-mouthed diverticula were                            found in the sigmoid  colon, descending colon,                            transverse colon, ascending colon and cecum.                           Non-bleeding external and internal hemorrhoids were                            found during retroflexion. The hemorrhoids were                            medium-sized. Complications:            No immediate complications. Estimated Blood Loss:     Estimated blood loss was minimal. Impression:               - Preparation of the colon  was inadequate.                           - One 5 mm polyp in the ascending colon, removed                            with a cold snare. Resected and retrieved.                           - Four 7 to 15 mm polyps in the transverse colon                            and in the ascending colon, removed with a hot                            snare. Resected and retrieved.                           - Moderate diverticulosis in the sigmoid colon, in                            the descending colon, in the transverse colon, in                            the ascending colon and in the cecum.                           - Non-bleeding external and internal hemorrhoids. Recommendation:           - Patient has a contact number available for                            emergencies. The signs and symptoms of potential                            delayed complications were discussed with the                            patient. Return to normal activities tomorrow.                            Written discharge instructions were provided to the                            patient.                           - Resume previous diet.                           - Continue present medications.                           - Await pathology results.                           -  Repeat colonoscopy at the next available                            appointment because the bowel preparation was                            suboptimal.                           - For future  colonoscopy the patient will require                            an extended preparation. If there are any                            questions, please contact the gastroenterologist. Mauri Pole, MD 12/11/2019 2:02:56 PM This report has been signed electronically.

## 2019-12-11 NOTE — Progress Notes (Signed)
Per Dr. Silverio Decamp pt needs to rescheduled for repeat colonoscopy at the next available appointment because the bowel prep was suboptimal.  Pt needs an extended prep.  Pt and husband did not want to schedule colonoscopy for 4 months or longer.  Dr. Silverio Decamp said this is okay.  Dustin Flock, RN put pt in for a recall colon with extended prep for 4 months.  Dr. Silverio Decamp is aware. maw  Per Dr. Silverio Decamp, bring pt's husband back into the recovery room.  MD spoke with pt & her husband about the findings from colonoscopy today. Maw  No problems noted in the recovery room. maw

## 2019-12-11 NOTE — Progress Notes (Signed)
Pt's states no medical or surgical changes since previsit or office visit.   V/s-cw  Check-in-jb 

## 2019-12-13 ENCOUNTER — Telehealth: Payer: Self-pay

## 2019-12-13 ENCOUNTER — Telehealth: Payer: Self-pay | Admitting: *Deleted

## 2019-12-13 NOTE — Telephone Encounter (Signed)
First attempt, left VM.  

## 2019-12-13 NOTE — Telephone Encounter (Signed)
  Follow up Call-  Call back number 12/11/2019  Post procedure Call Back phone  # [phone]  Permission to leave phone message No  Some recent data might be hidden     Patient questions:  Do you have a fever, pain , or abdominal swelling? No. Pain Score  0 *  Have you tolerated food without any problems? Yes.    Have you been able to return to your normal activities? Yes.    Do you have any questions about your discharge instructions: Diet   No. Medications  No. Follow up visit  No.  Do you have questions or concerns about your Care? No.  Actions: * If pain score is 4 or above: No action needed, pain <4.  1. Have you developed a fever since your procedure? no  2.   Have you had an respiratory symptoms (SOB or cough) since your procedure? no  3.   Have you tested positive for COVID 19 since your procedure no  4.   Have you had any family members/close contacts diagnosed with the COVID 19 since your procedure?  no   If yes to any of these questions please route to Joylene John, RN and Erenest Rasher, RN

## 2019-12-20 ENCOUNTER — Encounter: Payer: Self-pay | Admitting: Gastroenterology

## 2020-01-02 DIAGNOSIS — R69 Illness, unspecified: Secondary | ICD-10-CM | POA: Diagnosis not present

## 2020-01-15 DIAGNOSIS — E785 Hyperlipidemia, unspecified: Secondary | ICD-10-CM | POA: Diagnosis not present

## 2020-01-15 DIAGNOSIS — G3184 Mild cognitive impairment, so stated: Secondary | ICD-10-CM | POA: Diagnosis not present

## 2020-01-15 DIAGNOSIS — Z809 Family history of malignant neoplasm, unspecified: Secondary | ICD-10-CM | POA: Diagnosis not present

## 2020-01-15 DIAGNOSIS — R32 Unspecified urinary incontinence: Secondary | ICD-10-CM | POA: Diagnosis not present

## 2020-01-15 DIAGNOSIS — I10 Essential (primary) hypertension: Secondary | ICD-10-CM | POA: Diagnosis not present

## 2020-01-15 DIAGNOSIS — Z6830 Body mass index (BMI) 30.0-30.9, adult: Secondary | ICD-10-CM | POA: Diagnosis not present

## 2020-01-15 DIAGNOSIS — E669 Obesity, unspecified: Secondary | ICD-10-CM | POA: Diagnosis not present

## 2020-01-15 DIAGNOSIS — Z880 Allergy status to penicillin: Secondary | ICD-10-CM | POA: Diagnosis not present

## 2020-01-15 DIAGNOSIS — R69 Illness, unspecified: Secondary | ICD-10-CM | POA: Diagnosis not present

## 2020-08-01 DIAGNOSIS — J449 Chronic obstructive pulmonary disease, unspecified: Secondary | ICD-10-CM | POA: Diagnosis not present

## 2020-08-01 DIAGNOSIS — E041 Nontoxic single thyroid nodule: Secondary | ICD-10-CM | POA: Diagnosis not present

## 2020-08-01 DIAGNOSIS — Z139 Encounter for screening, unspecified: Secondary | ICD-10-CM | POA: Diagnosis not present

## 2020-08-01 DIAGNOSIS — Z4682 Encounter for fitting and adjustment of non-vascular catheter: Secondary | ICD-10-CM | POA: Diagnosis not present

## 2020-08-01 DIAGNOSIS — R570 Cardiogenic shock: Secondary | ICD-10-CM | POA: Diagnosis not present

## 2020-08-01 DIAGNOSIS — I071 Rheumatic tricuspid insufficiency: Secondary | ICD-10-CM | POA: Diagnosis not present

## 2020-08-01 DIAGNOSIS — I21A1 Myocardial infarction type 2: Secondary | ICD-10-CM | POA: Diagnosis not present

## 2020-08-01 DIAGNOSIS — J189 Pneumonia, unspecified organism: Secondary | ICD-10-CM | POA: Diagnosis not present

## 2020-08-01 DIAGNOSIS — I50811 Acute right heart failure: Secondary | ICD-10-CM | POA: Diagnosis not present

## 2020-08-01 DIAGNOSIS — R918 Other nonspecific abnormal finding of lung field: Secondary | ICD-10-CM | POA: Diagnosis not present

## 2020-08-01 DIAGNOSIS — R079 Chest pain, unspecified: Secondary | ICD-10-CM | POA: Diagnosis not present

## 2020-08-01 DIAGNOSIS — Z1331 Encounter for screening for depression: Secondary | ICD-10-CM | POA: Diagnosis not present

## 2020-08-01 DIAGNOSIS — R600 Localized edema: Secondary | ICD-10-CM | POA: Diagnosis not present

## 2020-08-01 DIAGNOSIS — Z20822 Contact with and (suspected) exposure to covid-19: Secondary | ICD-10-CM | POA: Diagnosis not present

## 2020-08-01 DIAGNOSIS — G9341 Metabolic encephalopathy: Secondary | ICD-10-CM | POA: Diagnosis not present

## 2020-08-01 DIAGNOSIS — J80 Acute respiratory distress syndrome: Secondary | ICD-10-CM | POA: Diagnosis not present

## 2020-08-01 DIAGNOSIS — J9690 Respiratory failure, unspecified, unspecified whether with hypoxia or hypercapnia: Secondary | ICD-10-CM | POA: Diagnosis not present

## 2020-08-01 DIAGNOSIS — J9601 Acute respiratory failure with hypoxia: Secondary | ICD-10-CM | POA: Diagnosis not present

## 2020-08-01 DIAGNOSIS — I471 Supraventricular tachycardia: Secondary | ICD-10-CM | POA: Diagnosis not present

## 2020-08-01 DIAGNOSIS — I110 Hypertensive heart disease with heart failure: Secondary | ICD-10-CM | POA: Diagnosis not present

## 2020-08-01 DIAGNOSIS — R69 Illness, unspecified: Secondary | ICD-10-CM | POA: Diagnosis not present

## 2020-08-01 DIAGNOSIS — I5040 Unspecified combined systolic (congestive) and diastolic (congestive) heart failure: Secondary | ICD-10-CM | POA: Diagnosis not present

## 2020-08-01 DIAGNOSIS — Z6832 Body mass index (BMI) 32.0-32.9, adult: Secondary | ICD-10-CM | POA: Diagnosis not present

## 2020-08-01 DIAGNOSIS — I272 Pulmonary hypertension, unspecified: Secondary | ICD-10-CM | POA: Diagnosis not present

## 2020-08-01 DIAGNOSIS — I517 Cardiomegaly: Secondary | ICD-10-CM | POA: Diagnosis not present

## 2020-08-01 DIAGNOSIS — E875 Hyperkalemia: Secondary | ICD-10-CM | POA: Diagnosis not present

## 2020-08-01 DIAGNOSIS — R0600 Dyspnea, unspecified: Secondary | ICD-10-CM | POA: Diagnosis not present

## 2020-08-01 DIAGNOSIS — I4891 Unspecified atrial fibrillation: Secondary | ICD-10-CM | POA: Diagnosis not present

## 2020-08-01 DIAGNOSIS — R509 Fever, unspecified: Secondary | ICD-10-CM | POA: Diagnosis not present

## 2020-08-01 DIAGNOSIS — R0602 Shortness of breath: Secondary | ICD-10-CM | POA: Diagnosis not present

## 2020-08-01 DIAGNOSIS — E785 Hyperlipidemia, unspecified: Secondary | ICD-10-CM | POA: Diagnosis not present

## 2020-08-01 DIAGNOSIS — R0902 Hypoxemia: Secondary | ICD-10-CM | POA: Diagnosis not present

## 2020-08-01 DIAGNOSIS — Z9981 Dependence on supplemental oxygen: Secondary | ICD-10-CM | POA: Diagnosis not present

## 2020-08-01 DIAGNOSIS — I361 Nonrheumatic tricuspid (valve) insufficiency: Secondary | ICD-10-CM | POA: Diagnosis not present

## 2020-08-01 DIAGNOSIS — Z452 Encounter for adjustment and management of vascular access device: Secondary | ICD-10-CM | POA: Diagnosis not present

## 2020-08-01 DIAGNOSIS — I499 Cardiac arrhythmia, unspecified: Secondary | ICD-10-CM | POA: Diagnosis not present

## 2020-08-01 DIAGNOSIS — I4892 Unspecified atrial flutter: Secondary | ICD-10-CM | POA: Diagnosis not present

## 2020-08-01 DIAGNOSIS — K449 Diaphragmatic hernia without obstruction or gangrene: Secondary | ICD-10-CM | POA: Diagnosis not present

## 2020-08-01 DIAGNOSIS — I10 Essential (primary) hypertension: Secondary | ICD-10-CM | POA: Diagnosis not present

## 2020-08-01 DIAGNOSIS — J810 Acute pulmonary edema: Secondary | ICD-10-CM | POA: Diagnosis not present

## 2020-08-01 DIAGNOSIS — R000 Tachycardia, unspecified: Secondary | ICD-10-CM | POA: Diagnosis not present

## 2020-08-01 DIAGNOSIS — J95851 Ventilator associated pneumonia: Secondary | ICD-10-CM | POA: Diagnosis not present

## 2020-08-01 DIAGNOSIS — J9811 Atelectasis: Secondary | ICD-10-CM | POA: Diagnosis not present

## 2020-08-01 DIAGNOSIS — J9602 Acute respiratory failure with hypercapnia: Secondary | ICD-10-CM | POA: Diagnosis not present

## 2020-08-01 DIAGNOSIS — I5023 Acute on chronic systolic (congestive) heart failure: Secondary | ICD-10-CM | POA: Diagnosis not present

## 2020-08-01 DIAGNOSIS — J9622 Acute and chronic respiratory failure with hypercapnia: Secondary | ICD-10-CM | POA: Diagnosis not present

## 2020-08-12 DIAGNOSIS — J449 Chronic obstructive pulmonary disease, unspecified: Secondary | ICD-10-CM | POA: Diagnosis not present

## 2020-08-14 DIAGNOSIS — J441 Chronic obstructive pulmonary disease with (acute) exacerbation: Secondary | ICD-10-CM | POA: Diagnosis not present

## 2020-08-14 DIAGNOSIS — J9601 Acute respiratory failure with hypoxia: Secondary | ICD-10-CM | POA: Diagnosis not present

## 2020-08-14 DIAGNOSIS — J449 Chronic obstructive pulmonary disease, unspecified: Secondary | ICD-10-CM | POA: Diagnosis not present

## 2020-08-14 DIAGNOSIS — I272 Pulmonary hypertension, unspecified: Secondary | ICD-10-CM | POA: Diagnosis not present

## 2020-08-18 DIAGNOSIS — J440 Chronic obstructive pulmonary disease with acute lower respiratory infection: Secondary | ICD-10-CM | POA: Diagnosis not present

## 2020-08-18 DIAGNOSIS — I21A1 Myocardial infarction type 2: Secondary | ICD-10-CM | POA: Diagnosis not present

## 2020-08-18 DIAGNOSIS — K602 Anal fissure, unspecified: Secondary | ICD-10-CM | POA: Diagnosis not present

## 2020-08-18 DIAGNOSIS — J9602 Acute respiratory failure with hypercapnia: Secondary | ICD-10-CM | POA: Diagnosis not present

## 2020-08-18 DIAGNOSIS — I272 Pulmonary hypertension, unspecified: Secondary | ICD-10-CM | POA: Diagnosis not present

## 2020-08-18 DIAGNOSIS — J811 Chronic pulmonary edema: Secondary | ICD-10-CM | POA: Diagnosis not present

## 2020-08-18 DIAGNOSIS — K5900 Constipation, unspecified: Secondary | ICD-10-CM | POA: Diagnosis not present

## 2020-08-18 DIAGNOSIS — I10 Essential (primary) hypertension: Secondary | ICD-10-CM | POA: Diagnosis not present

## 2020-08-18 DIAGNOSIS — J9601 Acute respiratory failure with hypoxia: Secondary | ICD-10-CM | POA: Diagnosis not present

## 2020-08-18 DIAGNOSIS — J181 Lobar pneumonia, unspecified organism: Secondary | ICD-10-CM | POA: Diagnosis not present

## 2020-08-20 DIAGNOSIS — J9601 Acute respiratory failure with hypoxia: Secondary | ICD-10-CM | POA: Diagnosis not present

## 2020-08-20 DIAGNOSIS — I21A1 Myocardial infarction type 2: Secondary | ICD-10-CM | POA: Diagnosis not present

## 2020-08-20 DIAGNOSIS — J440 Chronic obstructive pulmonary disease with acute lower respiratory infection: Secondary | ICD-10-CM | POA: Diagnosis not present

## 2020-08-20 DIAGNOSIS — I10 Essential (primary) hypertension: Secondary | ICD-10-CM | POA: Diagnosis not present

## 2020-08-20 DIAGNOSIS — J811 Chronic pulmonary edema: Secondary | ICD-10-CM | POA: Diagnosis not present

## 2020-08-20 DIAGNOSIS — J9602 Acute respiratory failure with hypercapnia: Secondary | ICD-10-CM | POA: Diagnosis not present

## 2020-08-20 DIAGNOSIS — I272 Pulmonary hypertension, unspecified: Secondary | ICD-10-CM | POA: Diagnosis not present

## 2020-08-20 DIAGNOSIS — K5900 Constipation, unspecified: Secondary | ICD-10-CM | POA: Diagnosis not present

## 2020-08-20 DIAGNOSIS — J181 Lobar pneumonia, unspecified organism: Secondary | ICD-10-CM | POA: Diagnosis not present

## 2020-08-20 DIAGNOSIS — K602 Anal fissure, unspecified: Secondary | ICD-10-CM | POA: Diagnosis not present

## 2020-08-21 DIAGNOSIS — J9601 Acute respiratory failure with hypoxia: Secondary | ICD-10-CM | POA: Diagnosis not present

## 2020-08-21 DIAGNOSIS — K602 Anal fissure, unspecified: Secondary | ICD-10-CM | POA: Diagnosis not present

## 2020-08-21 DIAGNOSIS — K5900 Constipation, unspecified: Secondary | ICD-10-CM | POA: Diagnosis not present

## 2020-08-21 DIAGNOSIS — J9602 Acute respiratory failure with hypercapnia: Secondary | ICD-10-CM | POA: Diagnosis not present

## 2020-08-21 DIAGNOSIS — J181 Lobar pneumonia, unspecified organism: Secondary | ICD-10-CM | POA: Diagnosis not present

## 2020-08-21 DIAGNOSIS — J440 Chronic obstructive pulmonary disease with acute lower respiratory infection: Secondary | ICD-10-CM | POA: Diagnosis not present

## 2020-08-21 DIAGNOSIS — I21A1 Myocardial infarction type 2: Secondary | ICD-10-CM | POA: Diagnosis not present

## 2020-08-21 DIAGNOSIS — I10 Essential (primary) hypertension: Secondary | ICD-10-CM | POA: Diagnosis not present

## 2020-08-21 DIAGNOSIS — J811 Chronic pulmonary edema: Secondary | ICD-10-CM | POA: Diagnosis not present

## 2020-08-21 DIAGNOSIS — I272 Pulmonary hypertension, unspecified: Secondary | ICD-10-CM | POA: Diagnosis not present

## 2020-08-25 DIAGNOSIS — R531 Weakness: Secondary | ICD-10-CM | POA: Diagnosis not present

## 2020-08-25 DIAGNOSIS — E669 Obesity, unspecified: Secondary | ICD-10-CM | POA: Diagnosis not present

## 2020-08-25 DIAGNOSIS — I272 Pulmonary hypertension, unspecified: Secondary | ICD-10-CM | POA: Diagnosis not present

## 2020-08-25 DIAGNOSIS — Z79899 Other long term (current) drug therapy: Secondary | ICD-10-CM | POA: Diagnosis not present

## 2020-08-25 DIAGNOSIS — Z6830 Body mass index (BMI) 30.0-30.9, adult: Secondary | ICD-10-CM | POA: Diagnosis not present

## 2020-08-25 DIAGNOSIS — J449 Chronic obstructive pulmonary disease, unspecified: Secondary | ICD-10-CM | POA: Diagnosis not present

## 2020-08-25 DIAGNOSIS — J9611 Chronic respiratory failure with hypoxia: Secondary | ICD-10-CM | POA: Diagnosis not present

## 2020-08-25 DIAGNOSIS — E876 Hypokalemia: Secondary | ICD-10-CM | POA: Diagnosis not present

## 2020-08-27 DIAGNOSIS — J181 Lobar pneumonia, unspecified organism: Secondary | ICD-10-CM | POA: Diagnosis not present

## 2020-08-27 DIAGNOSIS — I21A1 Myocardial infarction type 2: Secondary | ICD-10-CM | POA: Diagnosis not present

## 2020-08-27 DIAGNOSIS — K5900 Constipation, unspecified: Secondary | ICD-10-CM | POA: Diagnosis not present

## 2020-08-27 DIAGNOSIS — J440 Chronic obstructive pulmonary disease with acute lower respiratory infection: Secondary | ICD-10-CM | POA: Diagnosis not present

## 2020-08-27 DIAGNOSIS — J9601 Acute respiratory failure with hypoxia: Secondary | ICD-10-CM | POA: Diagnosis not present

## 2020-08-27 DIAGNOSIS — I272 Pulmonary hypertension, unspecified: Secondary | ICD-10-CM | POA: Diagnosis not present

## 2020-08-27 DIAGNOSIS — K602 Anal fissure, unspecified: Secondary | ICD-10-CM | POA: Diagnosis not present

## 2020-08-27 DIAGNOSIS — J9602 Acute respiratory failure with hypercapnia: Secondary | ICD-10-CM | POA: Diagnosis not present

## 2020-08-27 DIAGNOSIS — J811 Chronic pulmonary edema: Secondary | ICD-10-CM | POA: Diagnosis not present

## 2020-08-27 DIAGNOSIS — I10 Essential (primary) hypertension: Secondary | ICD-10-CM | POA: Diagnosis not present

## 2020-09-01 DIAGNOSIS — I272 Pulmonary hypertension, unspecified: Secondary | ICD-10-CM | POA: Diagnosis not present

## 2020-09-01 DIAGNOSIS — J440 Chronic obstructive pulmonary disease with acute lower respiratory infection: Secondary | ICD-10-CM | POA: Diagnosis not present

## 2020-09-01 DIAGNOSIS — J181 Lobar pneumonia, unspecified organism: Secondary | ICD-10-CM | POA: Diagnosis not present

## 2020-09-01 DIAGNOSIS — J9602 Acute respiratory failure with hypercapnia: Secondary | ICD-10-CM | POA: Diagnosis not present

## 2020-09-01 DIAGNOSIS — I21A1 Myocardial infarction type 2: Secondary | ICD-10-CM | POA: Diagnosis not present

## 2020-09-01 DIAGNOSIS — I10 Essential (primary) hypertension: Secondary | ICD-10-CM | POA: Diagnosis not present

## 2020-09-01 DIAGNOSIS — K5900 Constipation, unspecified: Secondary | ICD-10-CM | POA: Diagnosis not present

## 2020-09-01 DIAGNOSIS — K602 Anal fissure, unspecified: Secondary | ICD-10-CM | POA: Diagnosis not present

## 2020-09-01 DIAGNOSIS — J811 Chronic pulmonary edema: Secondary | ICD-10-CM | POA: Diagnosis not present

## 2020-09-01 DIAGNOSIS — J9601 Acute respiratory failure with hypoxia: Secondary | ICD-10-CM | POA: Diagnosis not present

## 2020-09-04 DIAGNOSIS — K5900 Constipation, unspecified: Secondary | ICD-10-CM | POA: Diagnosis not present

## 2020-09-04 DIAGNOSIS — J9601 Acute respiratory failure with hypoxia: Secondary | ICD-10-CM | POA: Diagnosis not present

## 2020-09-04 DIAGNOSIS — J440 Chronic obstructive pulmonary disease with acute lower respiratory infection: Secondary | ICD-10-CM | POA: Diagnosis not present

## 2020-09-04 DIAGNOSIS — I272 Pulmonary hypertension, unspecified: Secondary | ICD-10-CM | POA: Diagnosis not present

## 2020-09-04 DIAGNOSIS — I21A1 Myocardial infarction type 2: Secondary | ICD-10-CM | POA: Diagnosis not present

## 2020-09-04 DIAGNOSIS — I10 Essential (primary) hypertension: Secondary | ICD-10-CM | POA: Diagnosis not present

## 2020-09-04 DIAGNOSIS — J811 Chronic pulmonary edema: Secondary | ICD-10-CM | POA: Diagnosis not present

## 2020-09-04 DIAGNOSIS — J9602 Acute respiratory failure with hypercapnia: Secondary | ICD-10-CM | POA: Diagnosis not present

## 2020-09-04 DIAGNOSIS — K602 Anal fissure, unspecified: Secondary | ICD-10-CM | POA: Diagnosis not present

## 2020-09-04 DIAGNOSIS — J181 Lobar pneumonia, unspecified organism: Secondary | ICD-10-CM | POA: Diagnosis not present

## 2020-09-11 DIAGNOSIS — I21A1 Myocardial infarction type 2: Secondary | ICD-10-CM | POA: Diagnosis not present

## 2020-09-11 DIAGNOSIS — I10 Essential (primary) hypertension: Secondary | ICD-10-CM | POA: Diagnosis not present

## 2020-09-11 DIAGNOSIS — K5900 Constipation, unspecified: Secondary | ICD-10-CM | POA: Diagnosis not present

## 2020-09-11 DIAGNOSIS — J440 Chronic obstructive pulmonary disease with acute lower respiratory infection: Secondary | ICD-10-CM | POA: Diagnosis not present

## 2020-09-11 DIAGNOSIS — J9602 Acute respiratory failure with hypercapnia: Secondary | ICD-10-CM | POA: Diagnosis not present

## 2020-09-11 DIAGNOSIS — J181 Lobar pneumonia, unspecified organism: Secondary | ICD-10-CM | POA: Diagnosis not present

## 2020-09-11 DIAGNOSIS — J811 Chronic pulmonary edema: Secondary | ICD-10-CM | POA: Diagnosis not present

## 2020-09-11 DIAGNOSIS — I272 Pulmonary hypertension, unspecified: Secondary | ICD-10-CM | POA: Diagnosis not present

## 2020-09-11 DIAGNOSIS — K602 Anal fissure, unspecified: Secondary | ICD-10-CM | POA: Diagnosis not present

## 2020-09-11 DIAGNOSIS — J9601 Acute respiratory failure with hypoxia: Secondary | ICD-10-CM | POA: Diagnosis not present

## 2020-09-14 DIAGNOSIS — I272 Pulmonary hypertension, unspecified: Secondary | ICD-10-CM | POA: Diagnosis not present

## 2020-09-14 DIAGNOSIS — J441 Chronic obstructive pulmonary disease with (acute) exacerbation: Secondary | ICD-10-CM | POA: Diagnosis not present

## 2020-09-15 DIAGNOSIS — Z6830 Body mass index (BMI) 30.0-30.9, adult: Secondary | ICD-10-CM | POA: Diagnosis not present

## 2020-09-15 DIAGNOSIS — J9611 Chronic respiratory failure with hypoxia: Secondary | ICD-10-CM | POA: Diagnosis not present

## 2020-09-15 DIAGNOSIS — J449 Chronic obstructive pulmonary disease, unspecified: Secondary | ICD-10-CM | POA: Diagnosis not present

## 2020-09-18 DIAGNOSIS — K602 Anal fissure, unspecified: Secondary | ICD-10-CM | POA: Diagnosis not present

## 2020-09-18 DIAGNOSIS — I21A1 Myocardial infarction type 2: Secondary | ICD-10-CM | POA: Diagnosis not present

## 2020-09-18 DIAGNOSIS — K5900 Constipation, unspecified: Secondary | ICD-10-CM | POA: Diagnosis not present

## 2020-09-18 DIAGNOSIS — J9602 Acute respiratory failure with hypercapnia: Secondary | ICD-10-CM | POA: Diagnosis not present

## 2020-09-18 DIAGNOSIS — I10 Essential (primary) hypertension: Secondary | ICD-10-CM | POA: Diagnosis not present

## 2020-09-18 DIAGNOSIS — J181 Lobar pneumonia, unspecified organism: Secondary | ICD-10-CM | POA: Diagnosis not present

## 2020-09-18 DIAGNOSIS — J811 Chronic pulmonary edema: Secondary | ICD-10-CM | POA: Diagnosis not present

## 2020-09-18 DIAGNOSIS — I272 Pulmonary hypertension, unspecified: Secondary | ICD-10-CM | POA: Diagnosis not present

## 2020-09-18 DIAGNOSIS — J440 Chronic obstructive pulmonary disease with acute lower respiratory infection: Secondary | ICD-10-CM | POA: Diagnosis not present

## 2020-09-18 DIAGNOSIS — J9601 Acute respiratory failure with hypoxia: Secondary | ICD-10-CM | POA: Diagnosis not present

## 2020-09-24 DIAGNOSIS — K5900 Constipation, unspecified: Secondary | ICD-10-CM | POA: Diagnosis not present

## 2020-09-24 DIAGNOSIS — J440 Chronic obstructive pulmonary disease with acute lower respiratory infection: Secondary | ICD-10-CM | POA: Diagnosis not present

## 2020-09-24 DIAGNOSIS — I21A1 Myocardial infarction type 2: Secondary | ICD-10-CM | POA: Diagnosis not present

## 2020-09-24 DIAGNOSIS — J9601 Acute respiratory failure with hypoxia: Secondary | ICD-10-CM | POA: Diagnosis not present

## 2020-09-24 DIAGNOSIS — I10 Essential (primary) hypertension: Secondary | ICD-10-CM | POA: Diagnosis not present

## 2020-09-24 DIAGNOSIS — I272 Pulmonary hypertension, unspecified: Secondary | ICD-10-CM | POA: Diagnosis not present

## 2020-09-24 DIAGNOSIS — J181 Lobar pneumonia, unspecified organism: Secondary | ICD-10-CM | POA: Diagnosis not present

## 2020-09-24 DIAGNOSIS — J811 Chronic pulmonary edema: Secondary | ICD-10-CM | POA: Diagnosis not present

## 2020-09-24 DIAGNOSIS — J9602 Acute respiratory failure with hypercapnia: Secondary | ICD-10-CM | POA: Diagnosis not present

## 2020-09-24 DIAGNOSIS — K602 Anal fissure, unspecified: Secondary | ICD-10-CM | POA: Diagnosis not present

## 2020-09-30 DIAGNOSIS — R0600 Dyspnea, unspecified: Secondary | ICD-10-CM | POA: Diagnosis not present

## 2020-09-30 DIAGNOSIS — R498 Other voice and resonance disorders: Secondary | ICD-10-CM | POA: Diagnosis not present

## 2020-10-01 DIAGNOSIS — K5900 Constipation, unspecified: Secondary | ICD-10-CM | POA: Diagnosis not present

## 2020-10-01 DIAGNOSIS — J9602 Acute respiratory failure with hypercapnia: Secondary | ICD-10-CM | POA: Diagnosis not present

## 2020-10-01 DIAGNOSIS — J181 Lobar pneumonia, unspecified organism: Secondary | ICD-10-CM | POA: Diagnosis not present

## 2020-10-01 DIAGNOSIS — I10 Essential (primary) hypertension: Secondary | ICD-10-CM | POA: Diagnosis not present

## 2020-10-01 DIAGNOSIS — K602 Anal fissure, unspecified: Secondary | ICD-10-CM | POA: Diagnosis not present

## 2020-10-01 DIAGNOSIS — I272 Pulmonary hypertension, unspecified: Secondary | ICD-10-CM | POA: Diagnosis not present

## 2020-10-01 DIAGNOSIS — J440 Chronic obstructive pulmonary disease with acute lower respiratory infection: Secondary | ICD-10-CM | POA: Diagnosis not present

## 2020-10-01 DIAGNOSIS — J9601 Acute respiratory failure with hypoxia: Secondary | ICD-10-CM | POA: Diagnosis not present

## 2020-10-01 DIAGNOSIS — J811 Chronic pulmonary edema: Secondary | ICD-10-CM | POA: Diagnosis not present

## 2020-10-01 DIAGNOSIS — I21A1 Myocardial infarction type 2: Secondary | ICD-10-CM | POA: Diagnosis not present

## 2020-10-08 DIAGNOSIS — J811 Chronic pulmonary edema: Secondary | ICD-10-CM | POA: Diagnosis not present

## 2020-10-08 DIAGNOSIS — K602 Anal fissure, unspecified: Secondary | ICD-10-CM | POA: Diagnosis not present

## 2020-10-08 DIAGNOSIS — I10 Essential (primary) hypertension: Secondary | ICD-10-CM | POA: Diagnosis not present

## 2020-10-08 DIAGNOSIS — J9602 Acute respiratory failure with hypercapnia: Secondary | ICD-10-CM | POA: Diagnosis not present

## 2020-10-08 DIAGNOSIS — J9601 Acute respiratory failure with hypoxia: Secondary | ICD-10-CM | POA: Diagnosis not present

## 2020-10-08 DIAGNOSIS — I21A1 Myocardial infarction type 2: Secondary | ICD-10-CM | POA: Diagnosis not present

## 2020-10-08 DIAGNOSIS — J440 Chronic obstructive pulmonary disease with acute lower respiratory infection: Secondary | ICD-10-CM | POA: Diagnosis not present

## 2020-10-08 DIAGNOSIS — J181 Lobar pneumonia, unspecified organism: Secondary | ICD-10-CM | POA: Diagnosis not present

## 2020-10-08 DIAGNOSIS — K5900 Constipation, unspecified: Secondary | ICD-10-CM | POA: Diagnosis not present

## 2020-10-08 DIAGNOSIS — I272 Pulmonary hypertension, unspecified: Secondary | ICD-10-CM | POA: Diagnosis not present

## 2020-10-13 DIAGNOSIS — R498 Other voice and resonance disorders: Secondary | ICD-10-CM | POA: Diagnosis not present

## 2020-10-13 DIAGNOSIS — J4540 Moderate persistent asthma, uncomplicated: Secondary | ICD-10-CM | POA: Diagnosis not present

## 2020-10-13 DIAGNOSIS — R0600 Dyspnea, unspecified: Secondary | ICD-10-CM | POA: Diagnosis not present

## 2020-10-14 ENCOUNTER — Ambulatory Visit: Payer: Medicare HMO | Admitting: Internal Medicine

## 2020-10-14 DIAGNOSIS — I272 Pulmonary hypertension, unspecified: Secondary | ICD-10-CM | POA: Diagnosis not present

## 2020-10-14 DIAGNOSIS — J441 Chronic obstructive pulmonary disease with (acute) exacerbation: Secondary | ICD-10-CM | POA: Diagnosis not present

## 2020-10-15 ENCOUNTER — Emergency Department (HOSPITAL_COMMUNITY): Payer: Medicare HMO

## 2020-10-15 ENCOUNTER — Inpatient Hospital Stay (HOSPITAL_COMMUNITY)
Admission: EM | Admit: 2020-10-15 | Discharge: 2020-10-19 | DRG: 190 | Disposition: A | Discharge status: Home Health | Payer: Medicare HMO | Attending: Internal Medicine | Admitting: Internal Medicine

## 2020-10-15 ENCOUNTER — Other Ambulatory Visit: Payer: Self-pay

## 2020-10-15 ENCOUNTER — Encounter (HOSPITAL_COMMUNITY): Payer: Self-pay

## 2020-10-15 DIAGNOSIS — E871 Hypo-osmolality and hyponatremia: Secondary | ICD-10-CM | POA: Diagnosis not present

## 2020-10-15 DIAGNOSIS — F17210 Nicotine dependence, cigarettes, uncomplicated: Secondary | ICD-10-CM | POA: Diagnosis present

## 2020-10-15 DIAGNOSIS — E785 Hyperlipidemia, unspecified: Secondary | ICD-10-CM | POA: Diagnosis present

## 2020-10-15 DIAGNOSIS — Z20822 Contact with and (suspected) exposure to covid-19: Secondary | ICD-10-CM | POA: Diagnosis present

## 2020-10-15 DIAGNOSIS — Z885 Allergy status to narcotic agent status: Secondary | ICD-10-CM

## 2020-10-15 DIAGNOSIS — R0600 Dyspnea, unspecified: Secondary | ICD-10-CM | POA: Diagnosis not present

## 2020-10-15 DIAGNOSIS — Z743 Need for continuous supervision: Secondary | ICD-10-CM | POA: Diagnosis not present

## 2020-10-15 DIAGNOSIS — I10 Essential (primary) hypertension: Secondary | ICD-10-CM

## 2020-10-15 DIAGNOSIS — R21 Rash and other nonspecific skin eruption: Secondary | ICD-10-CM | POA: Diagnosis present

## 2020-10-15 DIAGNOSIS — I110 Hypertensive heart disease with heart failure: Secondary | ICD-10-CM | POA: Diagnosis present

## 2020-10-15 DIAGNOSIS — R069 Unspecified abnormalities of breathing: Secondary | ICD-10-CM | POA: Diagnosis not present

## 2020-10-15 DIAGNOSIS — M810 Age-related osteoporosis without current pathological fracture: Secondary | ICD-10-CM | POA: Diagnosis not present

## 2020-10-15 DIAGNOSIS — Z880 Allergy status to penicillin: Secondary | ICD-10-CM

## 2020-10-15 DIAGNOSIS — Z79899 Other long term (current) drug therapy: Secondary | ICD-10-CM | POA: Diagnosis not present

## 2020-10-15 DIAGNOSIS — I2729 Other secondary pulmonary hypertension: Secondary | ICD-10-CM | POA: Diagnosis present

## 2020-10-15 DIAGNOSIS — I5033 Acute on chronic diastolic (congestive) heart failure: Secondary | ICD-10-CM | POA: Diagnosis not present

## 2020-10-15 DIAGNOSIS — I252 Old myocardial infarction: Secondary | ICD-10-CM | POA: Diagnosis not present

## 2020-10-15 DIAGNOSIS — J441 Chronic obstructive pulmonary disease with (acute) exacerbation: Secondary | ICD-10-CM | POA: Diagnosis present

## 2020-10-15 DIAGNOSIS — I50812 Chronic right heart failure: Secondary | ICD-10-CM | POA: Diagnosis not present

## 2020-10-15 DIAGNOSIS — Z7951 Long term (current) use of inhaled steroids: Secondary | ICD-10-CM

## 2020-10-15 DIAGNOSIS — L299 Pruritus, unspecified: Secondary | ICD-10-CM | POA: Diagnosis present

## 2020-10-15 DIAGNOSIS — R0689 Other abnormalities of breathing: Secondary | ICD-10-CM | POA: Diagnosis not present

## 2020-10-15 DIAGNOSIS — Z7983 Long term (current) use of bisphosphonates: Secondary | ICD-10-CM

## 2020-10-15 DIAGNOSIS — Z720 Tobacco use: Secondary | ICD-10-CM | POA: Diagnosis present

## 2020-10-15 DIAGNOSIS — R0902 Hypoxemia: Secondary | ICD-10-CM | POA: Diagnosis not present

## 2020-10-15 DIAGNOSIS — I272 Pulmonary hypertension, unspecified: Secondary | ICD-10-CM | POA: Diagnosis present

## 2020-10-15 DIAGNOSIS — I5082 Biventricular heart failure: Secondary | ICD-10-CM | POA: Diagnosis present

## 2020-10-15 DIAGNOSIS — Z66 Do not resuscitate: Secondary | ICD-10-CM | POA: Diagnosis present

## 2020-10-15 DIAGNOSIS — J80 Acute respiratory distress syndrome: Secondary | ICD-10-CM | POA: Diagnosis not present

## 2020-10-15 DIAGNOSIS — R000 Tachycardia, unspecified: Secondary | ICD-10-CM | POA: Diagnosis not present

## 2020-10-15 HISTORY — DX: Chronic obstructive pulmonary disease, unspecified: J44.9

## 2020-10-15 HISTORY — DX: Other secondary pulmonary hypertension: I27.29

## 2020-10-15 HISTORY — DX: Tobacco use: Z72.0

## 2020-10-15 HISTORY — DX: Pulmonary hypertension, unspecified: I27.20

## 2020-10-15 HISTORY — DX: Right heart failure, unspecified: I50.810

## 2020-10-15 LAB — CBC WITH DIFFERENTIAL/PLATELET
Abs Immature Granulocytes: 0.04 10*3/uL (ref 0.00–0.07)
Basophils Absolute: 0.1 10*3/uL (ref 0.0–0.1)
Basophils Relative: 1 %
Eosinophils Absolute: 0 10*3/uL (ref 0.0–0.5)
Eosinophils Relative: 0 %
HCT: 48.7 % — ABNORMAL HIGH (ref 36.0–46.0)
Hemoglobin: 15.8 g/dL — ABNORMAL HIGH (ref 12.0–15.0)
Immature Granulocytes: 0 %
Lymphocytes Relative: 18 %
Lymphs Abs: 1.9 10*3/uL (ref 0.7–4.0)
MCH: 30.9 pg (ref 26.0–34.0)
MCHC: 32.4 g/dL (ref 30.0–36.0)
MCV: 95.3 fL (ref 80.0–100.0)
Monocytes Absolute: 0.9 10*3/uL (ref 0.1–1.0)
Monocytes Relative: 8 %
Neutro Abs: 8 10*3/uL — ABNORMAL HIGH (ref 1.7–7.7)
Neutrophils Relative %: 73 %
Platelets: 281 10*3/uL (ref 150–400)
RBC: 5.11 MIL/uL (ref 3.87–5.11)
RDW: 14.6 % (ref 11.5–15.5)
WBC: 10.9 10*3/uL — ABNORMAL HIGH (ref 4.0–10.5)
nRBC: 0 % (ref 0.0–0.2)

## 2020-10-15 LAB — BASIC METABOLIC PANEL
Anion gap: 12 (ref 5–15)
BUN: 12 mg/dL (ref 8–23)
CO2: 28 mmol/L (ref 22–32)
Calcium: 9.3 mg/dL (ref 8.9–10.3)
Chloride: 89 mmol/L — ABNORMAL LOW (ref 98–111)
Creatinine, Ser: 0.71 mg/dL (ref 0.44–1.00)
GFR, Estimated: >60 mL/min (ref >60)
Glucose, Bld: 162 mg/dL — ABNORMAL HIGH (ref 70–99)
Potassium: 4.5 mmol/L (ref 3.5–5.1)
Sodium: 129 mmol/L — ABNORMAL LOW (ref 135–145)

## 2020-10-15 LAB — RESP PANEL BY RT-PCR (FLU A&B, COVID) ARPGX2
Influenza A by PCR: NEGATIVE
Influenza B by PCR: NEGATIVE
SARS Coronavirus 2 by RT PCR: NEGATIVE

## 2020-10-15 LAB — BRAIN NATRIURETIC PEPTIDE: B Natriuretic Peptide: 153.7 pg/mL — ABNORMAL HIGH (ref 0.0–100.0)

## 2020-10-15 MED ORDER — METHYLPREDNISOLONE SODIUM SUCC 40 MG IJ SOLR
40.0000 mg | Freq: Four times a day (QID) | INTRAMUSCULAR | Status: AC
  Administered 2020-10-16 (×3): 40 mg via INTRAVENOUS
  Filled 2020-10-15 (×3): qty 1

## 2020-10-15 MED ORDER — UMECLIDINIUM BROMIDE 62.5 MCG/INH IN AEPB
1.0000 | INHALATION_SPRAY | Freq: Every day | RESPIRATORY_TRACT | Status: DC
  Administered 2020-10-16 – 2020-10-19 (×4): 1 via RESPIRATORY_TRACT
  Filled 2020-10-15: qty 7

## 2020-10-15 MED ORDER — ALBUTEROL (5 MG/ML) CONTINUOUS INHALATION SOLN
10.0000 mg/h | INHALATION_SOLUTION | Freq: Once | RESPIRATORY_TRACT | Status: AC
  Administered 2020-10-15: 10 mg/h via RESPIRATORY_TRACT
  Filled 2020-10-15: qty 20

## 2020-10-15 MED ORDER — IPRATROPIUM-ALBUTEROL 0.5-2.5 (3) MG/3ML IN SOLN
3.0000 mL | Freq: Once | RESPIRATORY_TRACT | Status: AC
  Administered 2020-10-15: 3 mL via RESPIRATORY_TRACT
  Filled 2020-10-15: qty 3

## 2020-10-15 MED ORDER — ALBUTEROL SULFATE HFA 108 (90 BASE) MCG/ACT IN AERS
2.0000 | INHALATION_SPRAY | RESPIRATORY_TRACT | Status: DC | PRN
  Filled 2020-10-15: qty 6.7

## 2020-10-15 MED ORDER — NICOTINE 14 MG/24HR TD PT24: 14.0000 mg | MEDICATED_PATCH | Freq: Every day | TRANSDERMAL | Status: DC

## 2020-10-15 MED ORDER — ENOXAPARIN SODIUM 30 MG/0.3ML IJ SOSY
30.0000 mg | PREFILLED_SYRINGE | INTRAMUSCULAR | Status: DC
  Administered 2020-10-16: 30 mg via SUBCUTANEOUS
  Filled 2020-10-15: qty 0.3

## 2020-10-15 MED ORDER — PREDNISONE 20 MG PO TABS
40.0000 mg | ORAL_TABLET | Freq: Every day | ORAL | Status: DC
  Administered 2020-10-17 – 2020-10-19 (×3): 40 mg via ORAL
  Filled 2020-10-15 (×3): qty 2

## 2020-10-15 MED ORDER — IPRATROPIUM-ALBUTEROL 0.5-2.5 (3) MG/3ML IN SOLN: 3.0000 mL | RESPIRATORY_TRACT | Status: DC | PRN

## 2020-10-15 MED ORDER — FUROSEMIDE 20 MG PO TABS
20.0000 mg | ORAL_TABLET | Freq: Every day | ORAL | Status: DC
  Administered 2020-10-16: 20 mg via ORAL
  Filled 2020-10-15: qty 1

## 2020-10-15 MED ORDER — FAMOTIDINE 20 MG PO TABS
10.0000 mg | ORAL_TABLET | Freq: Two times a day (BID) | ORAL | Status: DC
  Administered 2020-10-15 – 2020-10-19 (×8): 10 mg via ORAL
  Filled 2020-10-15 (×8): qty 1

## 2020-10-15 MED ORDER — FLUTICASONE FUROATE-VILANTEROL 200-25 MCG/INH IN AEPB
1.0000 | INHALATION_SPRAY | Freq: Every day | RESPIRATORY_TRACT | Status: DC
  Administered 2020-10-16 – 2020-10-19 (×4): 1 via RESPIRATORY_TRACT
  Filled 2020-10-15: qty 28

## 2020-10-15 MED ORDER — LACTATED RINGERS IV BOLUS
1000.0000 mL | Freq: Once | INTRAVENOUS | Status: AC
  Administered 2020-10-15: 1000 mL via INTRAVENOUS

## 2020-10-15 MED ORDER — FLUTICASONE-UMECLIDIN-VILANT 200-62.5-25 MCG/INH IN AEPB: 1.0000 | INHALATION_SPRAY | Freq: Every day | RESPIRATORY_TRACT | Status: DC

## 2020-10-15 MED ORDER — SODIUM CHLORIDE 0.9% FLUSH: 3.0000 mL | INTRAVENOUS | Status: DC | PRN

## 2020-10-15 MED ORDER — MAGNESIUM SULFATE 2 GM/50ML IV SOLN
2.0000 g | Freq: Once | INTRAVENOUS | Status: AC
  Administered 2020-10-15: 2 g via INTRAVENOUS
  Filled 2020-10-15: qty 50

## 2020-10-15 MED ORDER — VITAMIN B-12 1000 MCG PO TABS
1000.0000 ug | ORAL_TABLET | Freq: Every day | ORAL | Status: DC
  Administered 2020-10-16 – 2020-10-19 (×4): 1000 ug via ORAL
  Filled 2020-10-15 (×4): qty 1

## 2020-10-15 MED ORDER — SODIUM CHLORIDE 0.9% FLUSH: 3.0000 mL | Freq: Two times a day (BID) | INTRAVENOUS | Status: DC

## 2020-10-15 MED ORDER — METOPROLOL TARTRATE 100 MG PO TABS
100.0000 mg | ORAL_TABLET | Freq: Every day | ORAL | Status: DC
  Administered 2020-10-16 – 2020-10-19 (×4): 100 mg via ORAL
  Filled 2020-10-15 (×4): qty 1

## 2020-10-15 MED ORDER — ACETAMINOPHEN 325 MG PO TABS: 650.0000 mg | ORAL_TABLET | Freq: Four times a day (QID) | ORAL | Status: DC | PRN

## 2020-10-15 MED ORDER — SODIUM CHLORIDE 0.9 % IV SOLN: 250.0000 mL | INTRAVENOUS | Status: DC | PRN

## 2020-10-15 MED ORDER — BENAZEPRIL HCL 20 MG PO TABS
20.0000 mg | ORAL_TABLET | Freq: Every day | ORAL | Status: DC
  Administered 2020-10-16 – 2020-10-19 (×4): 20 mg via ORAL
  Filled 2020-10-15 (×5): qty 1

## 2020-10-15 MED ORDER — TRAZODONE HCL 50 MG PO TABS
25.0000 mg | ORAL_TABLET | Freq: Every day | ORAL | Status: DC
  Administered 2020-10-15 – 2020-10-18 (×4): 25 mg via ORAL
  Filled 2020-10-15 (×4): qty 1

## 2020-10-15 NOTE — ED Triage Notes (Signed)
Pt from home with [facility] em[facility] for SOB. Pt recently diagnosed with COPD, wears 2-3 L [place] at home as needed. When EMS arrived pt RR in the 40s, 88% on 3L [place] and lung sounds diminished in all fields. Pt given 125 solumedrol, albuterol and duoneb neb treatments. Resp now 22, HR 112, BP 150/72. Pt denies chest pain, reports improvement in her breathing.

## 2020-10-15 NOTE — H&P (Signed)
History and Physical    [name] [id number] DOB: [birth year] DOA: 10/15/2020  PCP: Cyndi Bender, PA-C / Dr. Lisbeth Ply Patient coming from: home  I have personally briefly reviewed patient's old medical records in [place]  Chief Complaint: SOB, wheezing  HPI: [name] is a 80 y.o. female with medical history significant of COPD with prior admissions for respiratory failure, most recently August 01, 2020 when she required intubation, HFpEF, tobacco abuse, osteoporosis. She was discharged [place] 3.22.22 on Ellipta and daily lasix. She had been doing relatively well but presents to [facility] ED  with concern for shortness of breath at home.  She has as needed 2 to 3 L supplemental oxygen for worsening shortness of breath secondary to COPD.  EMS was called this morning due to worsening shortness of breath and she was found to be satting in the mid 80s on 3 L oxygen with respiratory rate in the 40s.  There were diminished lung sounds in all lung fields.  She was administered Solu-Medrol 1.5 mg as well as a DuoNeb in the ambulance with improvement in her symptoms.  Patient also endorses 1 week of hoarseness of voice as well as 2 weeks of large patches of itchy rash on her left upper back.  She denies any chest pain, palpitations, denies any abdominal pain, nausea, vomiting, diarrhea, fevers or chills at home.  Patient with extensive history of smoking but not currently a smoker, having quit in March '22.  ED Course: T 98.2  121/82  HR 133, RR 26  O2 sat 93% on 4LNC O2. ED-PA exam notable for increased WOB with accessory muscle use, prolonged expiratory phase and diffuse wheezing. Lab WBC 10.9 w/ 73/18/8, Bmet with glucose 162, Cr 0.71  BNP 153.7 (In March >8,000), Covid NEGATIVE. CXR NAD. She was started on continuous albuterol nebulizer which was not tolerated, administered DuoNeb and Magnesium. She appeared to be fatiguing. TRH called to admit.   Review of Systems: As per HPI otherwise  10 point review of systems negative.    Past Medical History:  Diagnosis Date  . COPD (chronic obstructive pulmonary disease) ([place])   . Hyperlipidemia   . Hypertension   . Osteoporosis   . Pulmonary hypertension ([place])   . Right heart failure due to pulmonary hypertension ([place]) R[facility] heart cath March '22 at [facility]  . Tobacco abuse     Past Surgical History:  Procedure Laterality Date  . APPENDECTOMY  1954  . CATARACT EXTRACTION W/PHACO Right 11/24/2015   Procedure: CATARACT EXTRACTION PHACO AND INTRAOCULAR LENS PLACEMENT ([place]) suture placed in right eye at end of procedure;  Surgeon: Estill Cotta, MD;  Location: [facility] ORS;  Service: Ophthalmology;  Laterality: Right;  [place] 02:04 AP% 25.9 CDE 59.08 fluid pack lot # [id number]  . COLONOSCOPY  2014   Dr. Maurene Capes  . INTRAMEDULLARY (IM) NAIL INTERTROCHANTERIC Left 01/04/2015   Procedure: INTRAMEDULLARY (IM) NAIL INTERTROCHANTRIC;  Surgeon: Netta Cedars, MD;  Location: [facility] ORS;  Service: Orthopedics;  Laterality: Left;  . JOINT REPLACEMENT      Soc Hx - married 68 years. No children. Lives with her husband in their own home. Have been thinking about congregate living. Worked as Clinical cytogeneticist for [employer].    reports that she has been smoking cigarettes. She has been smoking about 0.50 packs per day. She has never used smokeless tobacco. She reports current alcohol use of about 14.0 standard drinks of alcohol per week. She reports that she does  not use drugs.  Allergies  Allergen Reactions  . Codeine Hives  . Penicillins Other (See Comments)    "unknown"    Family History  Problem Relation Age of Onset  . Colon cancer Father 25  . Colon polyps Neg Hx   . Esophageal cancer Neg Hx   . Rectal cancer Neg Hx   . Stomach cancer Neg Hx      Prior to Admission medications   Medication Sig Start Date End Date Taking? Authorizing Provider  acetaminophen (TYLENOL) 325 MG tablet Take 650 mg by mouth every 6 (six) hours as needed for mild  pain.   Yes [provider]  albuterol (VENTOLIN HFA) 108 (90 Base) MCG/ACT inhaler Inhale 2 puffs into the lungs every 4 (four) hours as needed for wheezing or shortness of breath. 10/02/20  Yes [provider]  alendronate (FOSAMAX) 70 MG tablet Take 70 mg by mouth once a week. 10/20/15  Yes [provider]  benazepril (LOTENSIN) 20 MG tablet Take 20 mg by mouth daily.   Yes [provider]  calcium-vitamin D (OSCAL WITH D) 500-200 MG-UNIT tablet Take 1 tablet by mouth daily with breakfast.   Yes [provider]  furosemide (LASIX) 20 MG tablet Take 20 mg by mouth daily. 09/30/20  Yes [provider]  metoprolol (LOPRESSOR) 100 MG tablet Take 100 mg by mouth daily. 09/25/15  Yes [provider]  Donnal Debar 200-62.5-25 MCG/INH AEPB Inhale 1 puff into the lungs daily. 10/13/20  Yes [provider]  vitamin B-12 (CYANOCOBALAMIN) 1000 MCG tablet Take 1,000 mcg by mouth daily.   Yes [provider]    Physical Exam: Vitals:   10/15/20 1500 10/15/20 1600 10/15/20 1700 10/15/20 1800  BP: 118/76 134/87 (!) 141/55 121/82  Pulse: (!) 119 (!) 110 (!) 131 (!) 133  Resp: (!) 23 (!) 26 (!) 25 (!) 26  Temp:      TempSrc:      SpO2: 94% 97% 97% 93%  Weight:      Height:         Vitals:   10/15/20 1500 10/15/20 1600 10/15/20 1700 10/15/20 1800  BP: 118/76 134/87 (!) 141/55 121/82  Pulse: (!) 119 (!) 110 (!) 131 (!) 133  Resp: (!) 23 (!) 26 (!) 25 (!) 26  Temp:      TempSrc:      SpO2: 94% 97% 97% 93%  Weight:      Height:       Gneral:  WNWD woman looking her stated age in no acute respiratory distress. Eyes: PERRL, lids and conjunctivae normal. Blind in left eye. ENMT: Mucous membranes are moist. Posterior pharynx clear of any exudate or lesions.Normal dentition.  Neck: normal, supple, no masses, no thyromegaly Respiratory: No increased WOB, no accessory muscle use, no neck retractions. Rales at right base.  Prolonged expiratory phase. No wheezing appreciated.  Cardiovascular: Regular tachycardia, no murmurs / rubs / gallops. No extremity edema. 2+ pedal pulses. No carotid bruits.  Abdomen: protuberant, no tenderness, no masses palpated. No hepatosplenomegaly. Bowel sounds positive.  Musculoskeletal: no clubbing / cyanosis. No joint deformity upper and lower extremities. Good ROM, no contractures. Normal muscle tone.  Skin: no lesions, ulcers. No induration. Rash on right back at level of scapular. Large circular lesion with scaling skin, no vesicles, no papules. Red dot satellite lesions. No dermatome distribution. Neurologic: CN 2-12 grossly intact. Sensation intact, DTR normal. Strength 5/5 in all 4.  Psychiatric: Normal judgment and insight. Alert  and oriented x 3. Normal mood.     Labs on Admission: I have personally reviewed following labs and imaging studies  CBC: Recent Labs  Lab 10/15/20 1305  WBC 10.9*  NEUTROABS 8.0*  HGB 15.8*  HCT 48.7*  MCV 95.3  PLT 268   Basic Metabolic Panel: Recent Labs  Lab 10/15/20 1305  NA 129*  K 4.5  CL 89*  CO2 28  GLUCOSE 162*  BUN 12  CREATININE 0.71  CALCIUM 9.3   GFR: Estimated Creatinine Clearance: 51.6 mL/min (by C-G formula based on SCr of 0.71 mg/dL). Liver Function Tests: No results for input(s): AST, ALT, ALKPHOS, BILITOT, PROT, ALBUMIN in the last 168 hours. No results for input(s): LIPASE, AMYLASE in the last 168 hours. No results for input(s): AMMONIA in the last 168 hours. Coagulation Profile: No results for input(s): INR, PROTIME in the last 168 hours. Cardiac Enzymes: No results for input(s): CKTOTAL, CKMB, CKMBINDEX, TROPONINI in the last 168 hours. BNP (last 3 results) No results for input(s): PROBNP in the last 8760 hours. HbA1C: No results for input(s): HGBA1C in the last 72 hours. CBG: No results for input(s): GLUCAP in the last 168 hours. Lipid Profile: No results for input(s): CHOL, HDL, LDLCALC, TRIG,  CHOLHDL, LDLDIRECT in the last 72 hours. Thyroid Function Tests: No results for input(s): TSH, T4TOTAL, FREET4, T3FREE, THYROIDAB in the last 72 hours. Anemia Panel: No results for input(s): VITAMINB12, FOLATE, FERRITIN, TIBC, IRON, RETICCTPCT in the last 72 hours. Urine analysis:    Component Value Date/Time   COLORURINE YELLOW 01/04/2015 1540   APPEARANCEUR CLEAR 01/04/2015 1540   LABSPEC 1.011 01/04/2015 1540   PHURINE 6.0 01/04/2015 1540   GLUCOSEU NEGATIVE 01/04/2015 1540   HGBUR NEGATIVE 01/04/2015 1540   BILIRUBINUR NEGATIVE 01/04/2015 1540   KETONESUR NEGATIVE 01/04/2015 1540   PROTEINUR NEGATIVE 01/04/2015 1540   UROBILINOGEN 0.2 01/04/2015 1540   NITRITE NEGATIVE 01/04/2015 1540   LEUKOCYTESUR NEGATIVE 01/04/2015 1540    Radiological Exams on Admission: DG Chest Port 1 View  Result Date: 10/15/2020 CLINICAL DATA:  Dyspnea. EXAM: PORTABLE CHEST 1 VIEW COMPARISON:  August 01, 2020. FINDINGS: The heart size and mediastinal contours are within normal limits. Both lungs are clear. The visualized skeletal structures are unremarkable. IMPRESSION: No active disease. Electronically Signed   By: Marijo Conception M.D.   On: 10/15/2020 14:33    EKG: Independently reviewed. Sinus tachycardia, LAE, no acute changes  Assessment/Plan Active Problems:   COPD with acute exacerbation (HCC)   HTN (hypertension)   Pulmonary hypertension (HCC)   Chronic right-sided CHF (congestive heart failure) (HCC)   Tobacco abuse    1. COPD exacerbation - patient with h/o in [place] '22 of acute exacerbation requiring intubation and MICU care. Did well and was dischared on Ellipta and prn albuterol. Follows with pulmonologist in [place]. Now with sudden acute exacerbation. No obvious triggering event: no sign of infection, no evidence acute HF. CXR NAD. She has responded well to acute treatment with steroids, bronchodilator tx and increased O2. Plan Med-surg admit  Solumedrol 40 mg q 6 xd 4 doses then  oral prednisone daily  Continue Ellipta  Continue abluterol MDI prn  Add Duonebs q4 prn for recurrent wheezing  F/u CXR in AM  2. HFpEF - previous admission March '22 with BNP >8000. Had [place] revealing Pulmonary HTN and low filling pressures. Did well with diuresis. Was discharged on daily lasix. At admission BNP 153.7, no physical evidence of decompensation. Plan Continue home meds: ACE-I,  BB, diuretic  F/u BNP in AM  3. HTN - stable. Continue home meds  4. Tobacco abuse - patient hasn't smoked since March '22.  5. Derm - patient with pruritic erythematous macular rash left back. Does no appear to be zoster. Plan H2 blocker for pruritis  Derm consult as outpatient   6. Code status - discussed with patient and spouse: has a living will. No Cardiac Resuscitation. YES for intubation for acute, reversible respiratory failure.    DVT prophylaxis: lovenox  Code Status: DNR but OK for intubation  Family Communication: spouse present during exam. Answered all questions  Disposition Plan: home when stable  Consults called: none  Admission status: inpatient    Adella Hare MD [facility] Pager [phone]  If 7PM-7AM, please contact night-coverage [email or web address] Password [facility]  10/15/2020, 7:05 PM

## 2020-10-15 NOTE — ED Provider Notes (Signed)
[facility] EMERGENCY DEPARTMENT Provider Note   CSN: [id number] Arrival date & time: 10/15/20  1250     History Chief Complaint  Patient presents with  . Shortness of Breath    [name] is a 80 y.o. female with history of COPD who presents with concern for shortness of breath at home.  She has as needed 2 to 3 L supplemental oxygen for worsening shortness of breath secondary to COPD.  EMS was called this morning due to worsening shortness of breath and she was found to be satting in the mid 80s on 3 L submental oxygen with respiratory rate in the 40s.  There is diminished lung sounds in all lung fields.  He is administered Solu-Medrol 1.5 mg as well as a DuoNeb in the ambulance with improvement in her symptoms.  Patient also endorses 1 week of hoarseness of voice as well as 2 weeks of large patches of itchy rash on her left upper back.  She denies any chest pain, palpitations, denies any abdominal pain, nausea, vomiting, diarrhea, fevers or chills at home.  Patient with extensive history of smoking but not currently a smoker.  Patient on Trelegy for COPD.  HPI     Past Medical History:  Diagnosis Date  . COPD (chronic obstructive pulmonary disease) ([place])   . Hyperlipidemia   . Hypertension   . Osteoporosis     Patient Active Problem List   Diagnosis Date Noted  . Acute blood loss anemia 01/09/2015  . Hip fracture ([place]) 01/04/2015  . Hyponatremia 01/04/2015  . HTN (hypertension) 01/04/2015  . Leukocytosis 01/04/2015  . Hip fracture requiring operative repair [facility]) 01/04/2015    Past Surgical History:  Procedure Laterality Date  . APPENDECTOMY  1954  . CATARACT EXTRACTION W/PHACO Right 11/24/2015   Procedure: CATARACT EXTRACTION PHACO AND INTRAOCULAR LENS PLACEMENT ([place]) suture placed in right eye at end of procedure;  Surgeon: Estill Cotta, MD;  Location: [facility] ORS;  Service: Ophthalmology;  Laterality: Right;  [place] 02:04 AP% 25.9 CDE  59.08 fluid pack lot # [id number]  . COLONOSCOPY  2014   Dr. Maurene Capes  . INTRAMEDULLARY (IM) NAIL INTERTROCHANTERIC Left 01/04/2015   Procedure: INTRAMEDULLARY (IM) NAIL INTERTROCHANTRIC;  Surgeon: Netta Cedars, MD;  Location: [facility] ORS;  Service: Orthopedics;  Laterality: Left;  . JOINT REPLACEMENT       OB History   No obstetric history on file.     Family History  Problem Relation Age of Onset  . Colon cancer Father 54  . Colon polyps Neg Hx   . Esophageal cancer Neg Hx   . Rectal cancer Neg Hx   . Stomach cancer Neg Hx     Social History   Tobacco Use  . Smoking status: Current Every Day Smoker    Packs/day: 0.50    Types: Cigarettes  . Smokeless tobacco: Never Used  Vaping Use  . Vaping Use: Never used  Substance Use Topics  . Alcohol use: Yes    Alcohol/week: 14.0 standard drinks    Types: 14 Standard drinks or equivalent per week  . Drug use: No    Home Medications Prior to Admission medications   Medication Sig Start Date End Date Taking? Authorizing Provider  acetaminophen (TYLENOL) 325 MG tablet Take 650 mg by mouth every 6 (six) hours as needed for mild pain.   Yes [provider]  albuterol (VENTOLIN HFA) 108 (90 Base) MCG/ACT inhaler Inhale 2 puffs into the lungs every 4 (four) hours as  needed for wheezing or shortness of breath. 10/02/20  Yes [provider]  alendronate (FOSAMAX) 70 MG tablet Take 70 mg by mouth once a week. 10/20/15  Yes [provider]  benazepril (LOTENSIN) 20 MG tablet Take 20 mg by mouth daily.   Yes [provider]  calcium-vitamin D (OSCAL WITH D) 500-200 MG-UNIT tablet Take 1 tablet by mouth daily with breakfast.   Yes [provider]  furosemide (LASIX) 20 MG tablet Take 20 mg by mouth daily. 09/30/20  Yes [provider]  metoprolol (LOPRESSOR) 100 MG tablet Take 100 mg by mouth daily. 09/25/15  Yes [provider]  Donnal Debar 200-62.5-25 MCG/INH AEPB Inhale 1 puff into the  lungs daily. 10/13/20  Yes [provider]  vitamin B-12 (CYANOCOBALAMIN) 1000 MCG tablet Take 1,000 mcg by mouth daily.   Yes [provider]    Allergies    Codeine and Penicillins  Review of Systems   Review of Systems  Constitutional: Positive for fatigue. Negative for activity change, appetite change, chills, diaphoresis and fever.  HENT: Positive for voice change. Negative for congestion, dental problem, sneezing, sore throat and trouble swallowing.        Hoarseness of voice  Eyes: Negative.   Respiratory: Positive for chest tightness, shortness of breath and wheezing. Negative for cough.   Cardiovascular: Negative for chest pain, palpitations and leg swelling.  Gastrointestinal: Negative.   Genitourinary: Negative.   Musculoskeletal: Negative.   Skin: Positive for rash.    Physical Exam Updated Vital Signs BP 121/82   Pulse (!) 133   Temp 98.2 F (36.8 C) (Oral)   Resp (!) 26   Ht 5\' 2"  (1.575 m)   Wt 65.8 kg   SpO2 93%   BMI 26.52 kg/m   Physical Exam Vitals and nursing note reviewed.  Constitutional:      Appearance: She is overweight. She is ill-appearing. She is not toxic-appearing.  HENT:     Head: Normocephalic and atraumatic.     Nose: Nose normal.     Mouth/Throat:     Mouth: Mucous membranes are moist.     Pharynx: Oropharynx is clear. Uvula midline. No oropharyngeal exudate, posterior oropharyngeal erythema or uvula swelling.     Tonsils: No tonsillar exudate.  Eyes:     General: Lids are normal. Vision grossly intact.        Right eye: No discharge.        Left eye: No discharge.     Extraocular Movements: Extraocular movements intact.     Conjunctiva/sclera: Conjunctivae normal.     Pupils: Pupils are equal, round, and reactive to light.  Neck:     Trachea: Trachea and phonation normal.  Cardiovascular:     Rate and Rhythm: Normal rate and regular rhythm.     Pulses: Normal pulses.     Heart sounds: Normal heart sounds. No  murmur heard.   Pulmonary:     Effort: Tachypnea, accessory muscle usage, prolonged expiration and respiratory distress present. No bradypnea or retractions.     Breath sounds: Decreased air movement present. Examination of the right-upper field reveals wheezing. Examination of the left-upper field reveals wheezing. Examination of the right-middle field reveals wheezing. Examination of the left-middle field reveals wheezing. Examination of the right-lower field reveals wheezing. Examination of the left-lower field reveals wheezing. Wheezing present. No rales.  Chest:     Chest wall: No mass, lacerations, deformity, swelling, tenderness, crepitus or edema.  Abdominal:  General: Bowel sounds are normal. There is no distension.     Palpations: Abdomen is soft.     Tenderness: There is no abdominal tenderness. There is no right CVA tenderness, left CVA tenderness, guarding or rebound.  Musculoskeletal:        General: No deformity.     Cervical back: Normal range of motion and neck supple. No edema, rigidity or crepitus. No pain with movement, spinous process tenderness or muscular tenderness.     Right lower leg: No edema.     Left lower leg: No edema.  Lymphadenopathy:     Cervical: No cervical adenopathy.  Skin:    General: Skin is warm and dry.       Neurological:     Mental Status: She is alert. Mental status is at baseline.     Sensory: Sensation is intact.     Motor: Motor function is intact.     Gait: Gait is intact.  Psychiatric:        Mood and Affect: Mood normal.          ED Results / Procedures / Treatments   Labs (all labs ordered are listed, but only abnormal results are displayed) Labs Reviewed  BASIC METABOLIC PANEL - Abnormal; Notable for the following components:      Result Value   Sodium 129 (*)    Chloride 89 (*)    Glucose, Bld 162 (*)    All other components within normal limits  CBC WITH DIFFERENTIAL/PLATELET - Abnormal; Notable for the following  components:   WBC 10.9 (*)    Hemoglobin 15.8 (*)    HCT 48.7 (*)    Neutro Abs 8.0 (*)    All other components within normal limits  BRAIN NATRIURETIC PEPTIDE - Abnormal; Notable for the following components:   B Natriuretic Peptide 153.7 (*)    All other components within normal limits  RESP PANEL BY RT-PCR (FLU A&B, COVID) [id number]    EKG EKG Interpretation  Date/Time:  Wednesday Oct 15 2020 13:00:14 EDT Ventricular Rate:  117 PR Interval:  147 QRS Duration: 90 QT Interval:  322 QTC Calculation: 450 R Axis:   104 Text Interpretation: Sinus tachycardia Left atrial enlargement Probable lateral infarct, old Anteroseptal infarct, age indeterminate Confirmed by Quintella Reichert [phone]) on 10/15/2020 1:02:46 PM   Radiology DG Chest Port 1 View  Result Date: 10/15/2020 CLINICAL DATA:  Dyspnea. EXAM: PORTABLE CHEST 1 VIEW COMPARISON:  August 01, 2020. FINDINGS: The heart size and mediastinal contours are within normal limits. Both lungs are clear. The visualized skeletal structures are unremarkable. IMPRESSION: No active disease. Electronically Signed   By: Marijo Conception M.D.   On: 10/15/2020 14:33    Procedures .Critical Care Performed by: Emeline Darling, PA-C Authorized by: Emeline Darling, PA-C   Critical care provider statement:    Critical care time (minutes):  45   Critical care was necessary to treat or prevent imminent or life-threatening deterioration of the following conditions:  Respiratory failure   Critical care was time spent personally by me on the following activities:  Discussions with consultants, evaluation of patient's response to treatment, examination of patient, ordering and performing treatments and interventions, ordering and review of laboratory studies, ordering and review of radiographic studies, pulse oximetry, re-evaluation of patient's condition, obtaining history from patient or surrogate and review of old charts     Medications Ordered  in ED Medications  ipratropium-albuterol (DUONEB) 0.5-2.5 (3) MG/3ML nebulizer solution 3 mL (3  mLs Nebulization Given 10/15/20 1335)  magnesium sulfate IVPB 2 g 50 mL (0 g Intravenous Stopped 10/15/20 1446)  lactated ringers bolus 1,000 mL (0 mLs Intravenous Stopped 10/15/20 1621)  albuterol (PROVENTIL,VENTOLIN) solution continuous neb (10 mg/hr Nebulization Given 10/15/20 1606)    ED Course  I have reviewed the triage vital signs and the nursing notes.  Pertinent labs & imaging results that were available during my care of the patient were reviewed by me and considered in my medical decision making (see chart for details).  Clinical Course as of 10/15/20 1827  Wed Oct 15, 2020  1538 Patient reevaluated with increasing wheezing in the lung fields bilaterally. Continuous albuterol treatment ordered.  [RS]    Clinical Course User Index [RS] Jakorian Marengo, Sharlene Dory   MDM Rules/Calculators/A&P                         80 year old female with history of COPD presents with concern for shortness of breath has been gradually worsening x1 week.  She is on 2 L supplemental oxygen at home as needed for shortness of breath.  Differential diagnosis included COPD exacerbation, pericarditis, ACS, respiratory infection, pneumonia, pleural effusion, PE.   Patient tachycardic and tachypneic on intake.  Oxygen saturation 97% on 4 L of supplemental oxygen by nasal cannula.  Cardiac exam revealed tachycardia, pulmonary exam with diffuse wheezing and decreased breath sounds throughout lung fields bilaterally.  Abdominal exam is benign.  Skin exam does reveal large erythematous, crusting rash on the left upper back concerning for herpes zoster.  Patient is neurovascularly intact extremities.  She does have significantly increased work of breathing, with accessory muscle use and tachypnea.  Patient received Solu-Medrol and DuoNeb with EMS.  Will receive magnesium and repeat DuoNeb here.  CBC with mild  leukocytosis of 10.9, BMP with mild hyponatremia 129.  BNP mildly elevated.  Respiratory pathogen panel negative for COVID-19 and influenza A/B.  Chest x-ray negative for acute cardiopulmonary disease.  EKG with sinus tachycardia.  Patient reevaluated after initial DuoNeb with significant improvement in her symptoms.  Will monitor and reevaluate.  Patient reevaluated once again with progressive wheezing throughout the lung fields bilaterally and worsening of her shortness of breath.  Will order continuous DuoNeb.  Patient had difficulty completing continuous albuterol nebulizer due to sensation of claustrophobia.  She did complete the nebulizer, however there is residual wheezing throughout the mid and lower lung fields bilaterally.  Patient states that her breathing feels more difficult, chest feels tight at this time, and she appears visibly fatigued.  Additionally she is tachycardic to the 120s to 130s.  Feel this patient would benefit from admission to the hospital for further management of her COPD exacerbation and stabilization.  Disposition plan discussed the patient.  Dorita voiced understanding for medical evaluation and treatment plan.  Each of her questions was answered to her expressed satisfaction.  She is amenable to plan for admission at this time.  Consult to hospitalist, as above. Patient to be admitted for stabilization.   This chart was dictated using voice recognition software, Dragon. Despite the best efforts of this provider to proofread and correct errors, errors may still occur which can change documentation meaning.  Final Clinical Impression(s) / ED Diagnoses Final diagnoses:  COPD exacerbation [facility])    Rx / DC Orders ED Discharge Orders    None       Aura Dials 10/15/20 1827    Quintella Reichert, MD 10/16/20  0751  

## 2020-10-15 NOTE — ED Notes (Signed)
Dinner tray ordered.

## 2020-10-16 DIAGNOSIS — I272 Pulmonary hypertension, unspecified: Secondary | ICD-10-CM

## 2020-10-16 DIAGNOSIS — J441 Chronic obstructive pulmonary disease with (acute) exacerbation: Principal | ICD-10-CM

## 2020-10-16 LAB — BASIC METABOLIC PANEL
Anion gap: 9 (ref 5–15)
BUN: 9 mg/dL (ref 8–23)
CO2: 29 mmol/L (ref 22–32)
Calcium: 9.4 mg/dL (ref 8.9–10.3)
Chloride: 93 mmol/L — ABNORMAL LOW (ref 98–111)
Creatinine, Ser: 0.56 mg/dL (ref 0.44–1.00)
GFR, Estimated: >60 mL/min (ref >60)
Glucose, Bld: 162 mg/dL — ABNORMAL HIGH (ref 70–99)
Potassium: 4.5 mmol/L (ref 3.5–5.1)
Sodium: 131 mmol/L — ABNORMAL LOW (ref 135–145)

## 2020-10-16 LAB — BRAIN NATRIURETIC PEPTIDE: B Natriuretic Peptide: 1383 pg/mL — ABNORMAL HIGH (ref 0.0–100.0)

## 2020-10-16 NOTE — Evaluation (Signed)
Occupational Therapy Evaluation Patient Details Name: [name] MRN: [id number] DOB: 05-10-1941 Today's Date: 10/16/2020    History of Present Illness 80 y.o. female presented to [facility] ED  with concern for shortness of breath; COPD exacerbation  PMH--COPD with prior admissions for respiratory failure including intubation in March 2022, HFpEF, tobacco abuse, osteoporosis.   Clinical Impression   PTA patient was living with her spouse in a private residence and was grossly I with ADLs/IADLs without AD and intermittent use of O2. Patient reports monitoring SpO2 in A.M. and P.M. but not wearing O2 for several hours throughout the day. Patient currently functioning slightly below baseline demonstrating observed ADLs including toileting/hygiene/clothing management with Min guard to supervision A. Patient also limited by deficits listed below including decreased cardiopulmonary endurance, decreased safety awareness attempting to take of O2 during ADL tasks, generalized weakness, and decreased activity tolernace and would benefit from continued acute OT services in prep for safe d/c home.      Follow Up Recommendations  Home health OT    Equipment Recommendations  None recommended by OT    Recommendations for Other Services       Precautions / Restrictions Precautions Precautions: Fall Restrictions Weight Bearing Restrictions: No      Mobility Bed Mobility Overal bed mobility: Modified Independent             General bed mobility comments: used bed rail with HOB flat    Transfers Overall transfer level: Needs assistance Equipment used: None Transfers: Sit to/from Stand Sit to Stand: Supervision;Min guard         General transfer comment: Min guard to supervision A for sit to stand tranfers from various surfaces.    Balance Overall balance assessment: Mild deficits observed, not formally tested                                         ADL either  performed or assessed with clinical judgement   ADL Overall ADL's : Needs assistance/impaired     Grooming: Supervision/safety;Standing Grooming Details (indicate cue type and reason): 2/3 grooming tasks standing at sink level with supervision A for safety/line management.                 Toilet Transfer: Copy Details (indicate cue type and reason): Supervision A for safety. Patient insistent on removing oxygen to complete toileting taks despite education on need for O2. Toileting- Clothing Manipulation and Hygiene: Supervision/safety Toileting - Clothing Manipulation Details (indicate cue type and reason): Supervision A for safety.             Vision Baseline Vision/History:  (Blind in L eye) Patient Visual Report: No change from baseline       Perception     Praxis      Pertinent Vitals/Pain Pain Assessment: No/denies pain     Hand Dominance Right   Extremity/Trunk Assessment Upper Extremity Assessment Upper Extremity Assessment: Generalized weakness   Lower Extremity Assessment Lower Extremity Assessment: Defer to PT evaluation   Cervical / Trunk Assessment Cervical / Trunk Assessment: Normal   Communication Communication Communication: No difficulties   Cognition Arousal/Alertness: Awake/alert Behavior During Therapy: WFL for tasks assessed/performed Overall Cognitive Status: No family/caregiver present to determine baseline cognitive functioning  General Comments: does not remember ever using a walker after she broke her hip "I'm not sure"   General Comments  VSS on 3L O2 via [place]    Exercises     Shoulder Instructions      Home Living Family/patient expects to be discharged to:: Private residence Living Arrangements: Spouse/significant other Available Help at Discharge: Family Type of Home: House Home Access: Other (comment) (chair lift from basement garage up to main  level)     Home Layout: Multi-level Alternate Level Stairs-Number of Steps: 3 (up to den) Alternate Level Stairs-Rails: Right Bathroom Shower/Tub: Tub/shower unit;Curtain   Bathroom Toilet: Handicapped height Bathroom Accessibility: Yes   Home Equipment: Shower seat;Other (comment) (home O2 @ 3L)          Prior Functioning/Environment Level of Independence: Independent                 OT Problem List: Decreased strength;Decreased activity tolerance;Impaired balance (sitting and/or standing);Cardiopulmonary status limiting activity      OT Treatment/Interventions: Self-care/ADL training;Therapeutic exercise;Energy conservation;Therapeutic activities;Patient/family education    OT Goals(Current goals can be found in the care plan section) Acute Rehab OT Goals Patient Stated Goal: get better and go ome OT Goal Formulation: With patient Time For Goal Achievement: 10/30/20 Potential to Achieve Goals: Good ADL Goals Additional ADL Goal #1: Patient will complete ADLs and ADL transfers with Mod I and AD/DME as needed. Additional ADL Goal #2: Patient will recall 3 energy conservation techinques in prep for ADLs/IADLs.  OT Frequency: Min 2X/week   Barriers to D/C:            Co-evaluation              AM-PAC OT "6 Clicks" Daily Activity     Outcome Measure Help from another person eating meals?: None Help from another person taking care of personal grooming?: A Little Help from another person toileting, which includes using toliet, bedpan, or urinal?: A Little Help from another person bathing (including washing, rinsing, drying)?: A Little Help from another person to put on and taking off regular upper body clothing?: None Help from another person to put on and taking off regular lower body clothing?: A Little 6 Click Score: 20   End of Session Equipment Utilized During Treatment: Gait belt;Oxygen (3L) Nurse Communication: Mobility status  Activity Tolerance:  Patient tolerated treatment well Patient left: in chair;with call bell/phone within reach;with chair alarm set  OT Visit Diagnosis: Unsteadiness on feet (R26.81);Muscle weakness (generalized) (M62.81)                Time: 1130-1147 OT Time Calculation (min): 17 min Charges:  OT General Charges $OT Visit: 1 Visit OT Evaluation $OT Eval Low Complexity: 1 Low  Lauryl Seyer H. OTR/L Supplemental OT, Department of rehab services [phone]  Ronav Furney R H. 10/16/2020, 12:22 PM

## 2020-10-16 NOTE — Evaluation (Signed)
Physical Therapy Evaluation Patient Details Name: [name] MRN: [id number] DOB: Aug 13, 1940 Today's Date: 10/16/2020   History of Present Illness  80 y.o. female presented to [facility] ED  with concern for shortness of breath; COPD exacerbation  PMH--COPD with prior admissions for respiratory failure including intubation in March 2022, HFpEF, tobacco abuse, osteoporosis.  Clinical Impression   Pt admitted secondary to problem above with deficits below. PTA patient was independent and using 3L home O2 (although states she sometimes goes for hours without it).  Pt currently requires min guard assist without device to ambulate. Reports she feels shakey, but no imbalance noted.  Will continue to follow acutely to maximize functional mobility independence and safety.       Follow Up Recommendations Home health PT    Equipment Recommendations  None recommended by PT    Recommendations for Other Services OT consult     Precautions / Restrictions Precautions Precautions: Fall      Mobility  Bed Mobility Overal bed mobility: Modified Independent             General bed mobility comments: used bed rail with HOB flat    Transfers Overall transfer level: Needs assistance Equipment used: None Transfers: Sit to/from Stand Sit to Stand: Min guard         General transfer comment: reports feeling shakey although no imbalance perceived  Ambulation/Gait Ambulation/Gait assistance: Min guard Gait Distance (Feet): 30 Feet Assistive device: None Gait Pattern/deviations: Step-through pattern;Decreased stride length;Wide base of support Gait velocity: slightly decr   General Gait Details: pt occasionally reaching to touch counter or table, otherwise no UE support. Again reports feeling shakey "but probably because I haven't been up"  Stairs            Wheelchair Mobility    Modified Rankin (Stroke Patients Only)       Balance Overall balance assessment: Mild deficits  observed, not formally tested                                           Pertinent Vitals/Pain Pain Assessment: No/denies pain    Home Living Family/patient expects to be discharged to:: Private residence Living Arrangements: Spouse/significant other Available Help at Discharge: Family Type of Home: House Home Access: Other (comment) (chair lift from basement garage up to main level)     Home Layout: Multi-level Home Equipment: Shower seat;Other (comment) (home O2 @ 3L)      Prior Function Level of Independence: Independent               Hand Dominance        Extremity/Trunk Assessment   Upper Extremity Assessment Upper Extremity Assessment: Defer to OT evaluation    Lower Extremity Assessment Lower Extremity Assessment: Generalized weakness    Cervical / Trunk Assessment Cervical / Trunk Assessment: Normal  Communication   Communication: No difficulties  Cognition Arousal/Alertness: Awake/alert Behavior During Therapy: WFL for tasks assessed/performed Overall Cognitive Status: No family/caregiver present to determine baseline cognitive functioning                                 General Comments: does not remember ever using a walker after she broke her hip "I'm not sure"      General Comments General comments (skin integrity, edema, etc.): on 3L with sats  96% HR 94    Exercises     Assessment/Plan    PT Assessment Patient needs continued PT services  PT Problem List Decreased strength;Decreased activity tolerance;Decreased balance;Decreased mobility;Decreased cognition;Decreased knowledge of use of DME;Cardiopulmonary status limiting activity       PT Treatment Interventions DME instruction;Gait training;Functional mobility training;Therapeutic activities;Therapeutic exercise;Balance training;Patient/family education    PT Goals (Current goals can be found in the Care Plan section)  Acute Rehab PT Goals Patient  Stated Goal: get better and go ome PT Goal Formulation: With patient Time For Goal Achievement: 10/23/20 Potential to Achieve Goals: Good    Frequency Min 3X/week   Barriers to discharge        Co-evaluation               AM-PAC PT "6 Clicks" Mobility  Outcome Measure Help needed turning from your back to your side while in a flat bed without using bedrails?: None Help needed moving from lying on your back to sitting on the side of a flat bed without using bedrails?: A Little Help needed moving to and from a bed to a chair (including a wheelchair)?: A Little Help needed standing up from a chair using your arms (e.g., wheelchair or bedside chair)?: A Little Help needed to walk in hospital room?: A Little Help needed climbing 3-5 steps with a railing? : A Little 6 Click Score: 19    End of Session Equipment Utilized During Treatment: Gait belt;Oxygen Activity Tolerance: Patient tolerated treatment well Patient left: in chair;with call bell/phone within reach;with chair alarm set Nurse Communication: Mobility status PT Visit Diagnosis: Unsteadiness on feet (R26.81)    Time: 8115-7262 PT Time Calculation (min) (ACUTE ONLY): 18 min   Charges:   PT Evaluation $PT Eval Low Complexity: 1 Low           Arby Barrette, PT Pager [phone]   Rexanne Mano 10/16/2020, 10:05 AM

## 2020-10-16 NOTE — Progress Notes (Signed)
PROGRESS NOTE    [name]  FTD:[id number] DOB: 08/19/1940 DOA: 10/15/2020 PCP: Cyndi Bender, PA-C    Brief Narrative:   80 y.o. female with medical history significant of COPD with prior admissions for respiratory failure, most recently August 01, 2020 when she required intubation, HFpEF, tobacco abuse, osteoporosis. She was discharged [place] 3.22.22 on Ellipta and daily lasix. She had been doing relatively well but presents to [facility] ED  with concern for shortness of breath at home. She has as needed 2 to 3 L supplemental oxygen for worsening shortness of breath secondary to COPD. EMS was called this morning due to worsening shortness of breath and she was found to be satting in the mid 80s on 3 L oxygen with respiratory rate in the 40s. There were diminished lung sounds in all lung fields. She was administered Solu-Medrol 1.5 mg as well as a DuoNeb in the ambulance with improvement in her symptoms  Assessment & Plan:   Active Problems:   HTN (hypertension)   COPD with acute exacerbation (HCC)   Pulmonary hypertension (HCC)   Tobacco abuse   Chronic right-sided CHF (congestive heart failure) ([place])   1. COPD exacerbation - patient noted to have recent hx of acute exacerbation requiring intubation and MICU care. Did well and was dischared on Ellipta and prn albuterol. Pt follows with pulmonologist in [place]. -Continued with Solumedrol 40 mg q 6 xd with plan to transition to oral steroid afterwards - Continue Ellipta -Continue abluterol MDI prn  -Cont on Duonebs q4 prn for recurrent wheezing -cont to wean O2 as tolerated  2. HFpEF - previous admission March '22 with BNP >8000. Had [place] revealing Pulmonary HTN and low filling pressures. Did well with diuresis. Was discharged on daily lasix. At admission BNP 153.7, no physical evidence of decompensation. -Pt is continued on home meds including ACE-I, BB, diuretic -repeat bmet in AM  3. HTN -BP stable and controlled at this  time - Continue home meds as tolerated  4. Tobacco abuse  - Pt reportedly had not smoked since 3/22  5. Derm - patient with pruritic erythematous macular rash left back - Pt has been continued on H2 blocker for pruritis -consider Derm consult as outpatient              6. Code status  - Admitting physician discussed with patient and spouse: has a living will. No Cardiac Resuscitation. YES for intubation for acute, reversible respiratory failure.    DVT prophylaxis: Lovenox subq Code Status: DNR Family Communication: Pt in room, family not at bedside  Status is: Inpatient  Remains inpatient appropriate because:Inpatient level of care appropriate due to severity of illness   Dispo: The patient is from: Home              Anticipated d/c is to: Home              Patient currently is not medically stable to d/c.   Difficult to place patient No       Consultants:     Procedures:     Antimicrobials: Anti-infectives (From admission, onward)   None       Subjective: Reports feeling better today. Hoping to go home soon. Still on O2  Objective: Vitals:   10/15/20 2101 10/16/20 0447 10/16/20 0811 10/16/20 1652  BP: 125/75 109/62  124/63  Pulse: (!) 115 83  73  Resp: 20 18  20   Temp: 98.5 F (36.9 C) 98.3 F (36.8 C)  98.2  F (36.8 C)  TempSrc: Oral Oral    SpO2: 98% 98% 95% 94%  Weight:      Height:       No intake or output data in the 24 hours ending 10/16/20 1812 Filed Weights   10/15/20 1257  Weight: 65.8 kg    Examination: General exam: Awake, laying in bed, in nad Respiratory system: Slightly increased resp effort, no audible wheeze, [place] in place Cardiovascular system: regular rate, s1, s2 Gastrointestinal system: Soft, nondistended, positive BS Central nervous system: CN2-12 grossly intact, strength intact Extremities: Perfused, no clubbing Skin: Normal skin turgor, no notable skin lesions seen Psychiatry: Mood normal // no visual  hallucinations   Data Reviewed: I have personally reviewed following labs and imaging studies  CBC: Recent Labs  Lab 10/15/20 1305  WBC 10.9*  NEUTROABS 8.0*  HGB 15.8*  HCT 48.7*  MCV 95.3  PLT 751   Basic Metabolic Panel: Recent Labs  Lab 10/15/20 1305 10/16/20 0241  NA 129* 131*  K 4.5 4.5  CL 89* 93*  CO2 28 29  GLUCOSE 162* 162*  BUN 12 9  CREATININE 0.71 0.56  CALCIUM 9.3 9.4   GFR: Estimated Creatinine Clearance: 51.6 mL/min (by C-G formula based on SCr of 0.56 mg/dL). Liver Function Tests: No results for input(s): AST, ALT, ALKPHOS, BILITOT, PROT, ALBUMIN in the last 168 hours. No results for input(s): LIPASE, AMYLASE in the last 168 hours. No results for input(s): AMMONIA in the last 168 hours. Coagulation Profile: No results for input(s): INR, PROTIME in the last 168 hours. Cardiac Enzymes: No results for input(s): CKTOTAL, CKMB, CKMBINDEX, TROPONINI in the last 168 hours. BNP (last 3 results) No results for input(s): PROBNP in the last 8760 hours. HbA1C: No results for input(s): HGBA1C in the last 72 hours. CBG: No results for input(s): GLUCAP in the last 168 hours. Lipid Profile: No results for input(s): CHOL, HDL, LDLCALC, TRIG, CHOLHDL, LDLDIRECT in the last 72 hours. Thyroid Function Tests: No results for input(s): TSH, T4TOTAL, FREET4, T3FREE, THYROIDAB in the last 72 hours. Anemia Panel: No results for input(s): VITAMINB12, FOLATE, FERRITIN, TIBC, IRON, RETICCTPCT in the last 72 hours. Sepsis Labs: No results for input(s): PROCALCITON, LATICACIDVEN in the last 168 hours.  Recent Results (from the past 240 hour(s))  Resp Panel by RT-PCR (Flu A&B, Covid) Nasopharyngeal Swab     Status: None   Collection Time: 10/15/20  1:05 PM   Specimen: Nasopharyngeal Swab; Nasopharyngeal(NP) swabs in vial transport medium  Result Value Ref Range Status   SARS Coronavirus 2 by RT PCR NEGATIVE NEGATIVE Final    Comment: (NOTE) SARS-CoV-2 target nucleic  acids are NOT DETECTED.  The SARS-CoV-2 RNA is generally detectable in upper respiratory specimens during the acute phase of infection. The lowest concentration of SARS-CoV-2 viral copies this assay can detect is 138 copies/mL. A negative result does not preclude SARS-Cov-2 infection and should not be used as the sole basis for treatment or other patient management decisions. A negative result may occur with  improper specimen collection/handling, submission of specimen other than nasopharyngeal swab, presence of viral mutation(s) within the areas targeted by this assay, and inadequate number of viral copies(<138 copies/mL). A negative result must be combined with clinical observations, patient history, and epidemiological information. The expected result is Negative.  Fact Sheet for Patients:  EntrepreneurPulse.com.au  Fact Sheet for Healthcare Providers:  IncredibleEmployment.be  This test is no t yet approved or cleared by the [place] FDA and  has been  authorized for detection and/or diagnosis of SARS-CoV-2 by FDA under an Emergency Use Authorization (EUA). This EUA will remain  in effect (meaning this test can be used) for the duration of the COVID-19 declaration under Section 564(b)(1) of the Act, 21 U.S.C.section 360bbb-3(b)(1), unless the authorization is terminated  or revoked sooner.       Influenza A by PCR NEGATIVE NEGATIVE Final   Influenza B by PCR NEGATIVE NEGATIVE Final    Comment: (NOTE) The Xpert Xpress SARS-CoV-2/FLU/RSV plus assay is intended as an aid in the diagnosis of influenza from Nasopharyngeal swab specimens and should not be used as a sole basis for treatment. Nasal washings and aspirates are unacceptable for Xpert Xpress SARS-CoV-2/FLU/RSV testing.  Fact Sheet for Patients: EntrepreneurPulse.com.au  Fact Sheet for Healthcare Providers: IncredibleEmployment.be  This  test is not yet approved or cleared by the [place] FDA and has been authorized for detection and/or diagnosis of SARS-CoV-2 by FDA under an Emergency Use Authorization (EUA). This EUA will remain in effect (meaning this test can be used) for the duration of the COVID-19 declaration under Section 564(b)(1) of the Act, 21 U.S.C. section 360bbb-3(b)(1), unless the authorization is terminated or revoked.  Performed at [facility], [place] [address] [place], [place] [address]      Radiology Studies: DG Chest Port 1 View  Result Date: 10/15/2020 CLINICAL DATA:  Dyspnea. EXAM: PORTABLE CHEST 1 VIEW COMPARISON:  August 01, 2020. FINDINGS: The heart size and mediastinal contours are within normal limits. Both lungs are clear. The visualized skeletal structures are unremarkable. IMPRESSION: No active disease. Electronically Signed   By: Marijo Conception M.D.   On: 10/15/2020 14:33    Scheduled Meds: . benazepril  20 mg Oral Daily  . enoxaparin (LOVENOX) injection  30 mg Subcutaneous Q24H  . famotidine  10 mg Oral BID  . fluticasone furoate-vilanterol  1 puff Inhalation Daily   And  . umeclidinium bromide  1 puff Inhalation Daily  . furosemide  20 mg Oral Daily  . methylPREDNISolone (SOLU-MEDROL) injection  40 mg Intravenous Q6H   Followed by  . [START ON 10/17/2020] predniSONE  40 mg Oral Q breakfast  . metoprolol tartrate  100 mg Oral Daily  . traZODone  25 mg Oral QHS  . vitamin B-12  1,000 mcg Oral Daily   Continuous Infusions:   LOS: 1 day   Marylu Lund, MD [facility] Pager On Amion  If 7PM-7AM, please contact night-coverage 10/16/2020, 6:12 PM

## 2020-10-17 LAB — COMPREHENSIVE METABOLIC PANEL
ALT: 13 U/L (ref 0–44)
AST: 15 U/L (ref 15–41)
Albumin: 3.3 g/dL — ABNORMAL LOW (ref 3.5–5.0)
Alkaline Phosphatase: 42 U/L (ref 38–126)
Anion gap: 4 — ABNORMAL LOW (ref 5–15)
BUN: 17 mg/dL (ref 8–23)
CO2: 33 mmol/L — ABNORMAL HIGH (ref 22–32)
Calcium: 8.9 mg/dL (ref 8.9–10.3)
Chloride: 92 mmol/L — ABNORMAL LOW (ref 98–111)
Creatinine, Ser: 0.77 mg/dL (ref 0.44–1.00)
GFR, Estimated: >60 mL/min (ref >60)
Glucose, Bld: 100 mg/dL — ABNORMAL HIGH (ref 70–99)
Potassium: 4.3 mmol/L (ref 3.5–5.1)
Sodium: 129 mmol/L — ABNORMAL LOW (ref 135–145)
Total Bilirubin: 0.6 mg/dL (ref 0.3–1.2)
Total Protein: 5.9 g/dL — ABNORMAL LOW (ref 6.5–8.1)

## 2020-10-17 MED ORDER — FUROSEMIDE 10 MG/ML IJ SOLN
40.0000 mg | Freq: Two times a day (BID) | INTRAMUSCULAR | Status: DC
  Administered 2020-10-17 – 2020-10-18 (×3): 40 mg via INTRAVENOUS
  Filled 2020-10-17 (×3): qty 4

## 2020-10-17 MED ORDER — ENOXAPARIN SODIUM 40 MG/0.4ML IJ SOSY
40.0000 mg | PREFILLED_SYRINGE | INTRAMUSCULAR | Status: DC
  Administered 2020-10-17 – 2020-10-18 (×2): 40 mg via SUBCUTANEOUS
  Filled 2020-10-17 (×2): qty 0.4

## 2020-10-17 NOTE — Progress Notes (Signed)
PROGRESS NOTE    [name]  AVW:[id number] DOB: 27-Jul-1940 DOA: 10/15/2020 PCP: Cyndi Bender, PA-C    Brief Narrative:   80 y.o. female with medical history significant of COPD with prior admissions for respiratory failure, most recently August 01, 2020 when she required intubation, HFpEF, tobacco abuse, osteoporosis. She was discharged [place] 3.22.22 on Ellipta and daily lasix. She had been doing relatively well but presents to [facility] ED  with concern for shortness of breath at home. She has as needed 2 to 3 L supplemental oxygen for worsening shortness of breath secondary to COPD. EMS was called this morning due to worsening shortness of breath and she was found to be satting in the mid 80s on 3 L oxygen with respiratory rate in the 40s. There were diminished lung sounds in all lung fields. She was administered Solu-Medrol 1.5 mg as well as a DuoNeb in the ambulance with improvement in her symptoms  Assessment & Plan:   Active Problems:   HTN (hypertension)   COPD with acute exacerbation (HCC)   Pulmonary hypertension (HCC)   Tobacco abuse   Chronic right-sided CHF (congestive heart failure) ([place])   1. COPD exacerbation - patient noted to have recent hx of acute exacerbation requiring intubation and MICU care. Did well and was dischared on Ellipta and prn albuterol. Pt follows with pulmonologist in [place]. -Was continued on IV steroids, now on prednisone - Continue Ellipta -Continue abluterol MDI prn  -Cont on Duonebs q4 prn for recurrent wheezing -cont to wean O2 as tolerated -Able to ambulate on room air today  2. HFpEF - previous admission March '22 with BNP >8000. Had [place] revealing Pulmonary HTN and low filling pressures. Did well with diuresis. Was discharged on daily lasix. At admission BNP 153.7, with repeat BNP of 1383 -Pt has been continued on IV lasix with good diuresis -Pt is continued on home meds including ACE-I, BB, diuretic -recheck bmet in AM  3.  HTN - BP stable and controlled at this time - Continue home meds as tolerated  4. Tobacco abuse  - Pt reportedly had not smoked since 3/22  5. Derm - patient with pruritic erythematous macular rash left back - Pt has been continued on H2 blocker for pruritis -recommend Derm consult as outpatient              6. Code status  - Admitting physician discussed with patient and spouse: has a living will. No Cardiac Resuscitation. YES for intubation for acute, reversible respiratory failure.    DVT prophylaxis: Lovenox subq Code Status: DNR Family Communication: Pt in room, family not at bedside  Status is: Inpatient  Remains inpatient appropriate because:Inpatient level of care appropriate due to severity of illness   Dispo: The patient is from: Home              Anticipated d/c is to: Home              Patient currently is not medically stable to d/c.   Difficult to place patient No    Consultants:     Procedures:     Antimicrobials: Anti-infectives (From admission, onward)   None      Subjective: Reports feeling better today  Objective: Vitals:   10/17/20 0300 10/17/20 0803 10/17/20 0900 10/17/20 1225  BP: 121/63  (!) 113/59 (!) 92/53  Pulse: 80  72 (!) 58  Resp: 18  18 18   Temp: 98 F (36.7 C)   98.2 F (36.8 C)  TempSrc: Oral     SpO2: 96% 98% 98% 97%  Weight:      Height:       No intake or output data in the 24 hours ending 10/17/20 1710 Filed Weights   10/15/20 1257  Weight: 65.8 kg    Examination: General exam: Conversant, in no acute distress Respiratory system: normal chest rise, clear, no audible wheezing,  Cardiovascular system: regular rhythm, s1-s2 Gastrointestinal system: Nondistended, nontender, pos BS Central nervous system: No seizures, no tremors Extremities: No cyanosis, no joint deformities Skin: No rashes, no pallor Psychiatry: Affect normal // no auditory hallucinations    Data Reviewed: I have personally reviewed  following labs and imaging studies  CBC: Recent Labs  Lab 10/15/20 1305  WBC 10.9*  NEUTROABS 8.0*  HGB 15.8*  HCT 48.7*  MCV 95.3  PLT 242   Basic Metabolic Panel: Recent Labs  Lab 10/15/20 1305 10/16/20 0241 10/17/20 0425  NA 129* 131* 129*  K 4.5 4.5 4.3  CL 89* 93* 92*  CO2 28 29 33*  GLUCOSE 162* 162* 100*  BUN 12 9 17   CREATININE 0.71 0.56 0.77  CALCIUM 9.3 9.4 8.9   GFR: Estimated Creatinine Clearance: 51.6 mL/min (by C-G formula based on SCr of 0.77 mg/dL). Liver Function Tests: Recent Labs  Lab 10/17/20 0425  AST 15  ALT 13  ALKPHOS 42  BILITOT 0.6  PROT 5.9*  ALBUMIN 3.3*   No results for input(s): LIPASE, AMYLASE in the last 168 hours. No results for input(s): AMMONIA in the last 168 hours. Coagulation Profile: No results for input(s): INR, PROTIME in the last 168 hours. Cardiac Enzymes: No results for input(s): CKTOTAL, CKMB, CKMBINDEX, TROPONINI in the last 168 hours. BNP (last 3 results) No results for input(s): PROBNP in the last 8760 hours. HbA1C: No results for input(s): HGBA1C in the last 72 hours. CBG: No results for input(s): GLUCAP in the last 168 hours. Lipid Profile: No results for input(s): CHOL, HDL, LDLCALC, TRIG, CHOLHDL, LDLDIRECT in the last 72 hours. Thyroid Function Tests: No results for input(s): TSH, T4TOTAL, FREET4, T3FREE, THYROIDAB in the last 72 hours. Anemia Panel: No results for input(s): VITAMINB12, FOLATE, FERRITIN, TIBC, IRON, RETICCTPCT in the last 72 hours. Sepsis Labs: No results for input(s): PROCALCITON, LATICACIDVEN in the last 168 hours.  Recent Results (from the past 240 hour(s))  Resp Panel by RT-PCR (Flu A&B, Covid) Nasopharyngeal Swab     Status: None   Collection Time: 10/15/20  1:05 PM   Specimen: Nasopharyngeal Swab; Nasopharyngeal(NP) swabs in vial transport medium  Result Value Ref Range Status   SARS Coronavirus 2 by RT PCR NEGATIVE NEGATIVE Final    Comment: (NOTE) SARS-CoV-2 target  nucleic acids are NOT DETECTED.  The SARS-CoV-2 RNA is generally detectable in upper respiratory specimens during the acute phase of infection. The lowest concentration of SARS-CoV-2 viral copies this assay can detect is 138 copies/mL. A negative result does not preclude SARS-Cov-2 infection and should not be used as the sole basis for treatment or other patient management decisions. A negative result may occur with  improper specimen collection/handling, submission of specimen other than nasopharyngeal swab, presence of viral mutation(s) within the areas targeted by this assay, and inadequate number of viral copies(<138 copies/mL). A negative result must be combined with clinical observations, patient history, and epidemiological information. The expected result is Negative.  Fact Sheet for Patients:  EntrepreneurPulse.com.au  Fact Sheet for Healthcare Providers:  IncredibleEmployment.be  This test is no t yet approved  or cleared by the [place] and  has been authorized for detection and/or diagnosis of SARS-CoV-2 by FDA under an Emergency Use Authorization (EUA). This EUA will remain  in effect (meaning this test can be used) for the duration of the COVID-19 declaration under Section 564(b)(1) of the Act, 21 U.S.C.section 360bbb-3(b)(1), unless the authorization is terminated  or revoked sooner.       Influenza A by PCR NEGATIVE NEGATIVE Final   Influenza B by PCR NEGATIVE NEGATIVE Final    Comment: (NOTE) The Xpert Xpress SARS-CoV-2/FLU/RSV plus assay is intended as an aid in the diagnosis of influenza from Nasopharyngeal swab specimens and should not be used as a sole basis for treatment. Nasal washings and aspirates are unacceptable for Xpert Xpress SARS-CoV-2/FLU/RSV testing.  Fact Sheet for Patients: EntrepreneurPulse.com.au  Fact Sheet for Healthcare  Providers: IncredibleEmployment.be  This test is not yet approved or cleared by the [place] FDA and has been authorized for detection and/or diagnosis of SARS-CoV-2 by FDA under an Emergency Use Authorization (EUA). This EUA will remain in effect (meaning this test can be used) for the duration of the COVID-19 declaration under Section 564(b)(1) of the Act, 21 U.S.C. section 360bbb-3(b)(1), unless the authorization is terminated or revoked.  Performed at [facility], [place] [address], [place],  [address]      Radiology Studies: No results found.  Scheduled Meds: . benazepril  20 mg Oral Daily  . enoxaparin (LOVENOX) injection  40 mg Subcutaneous Q24H  . famotidine  10 mg Oral BID  . fluticasone furoate-vilanterol  1 puff Inhalation Daily   And  . umeclidinium bromide  1 puff Inhalation Daily  . furosemide  40 mg Intravenous BID  . metoprolol tartrate  100 mg Oral Daily  . predniSONE  40 mg Oral Q breakfast  . traZODone  25 mg Oral QHS  . vitamin B-12  1,000 mcg Oral Daily   Continuous Infusions:   LOS: 2 days   Marylu Lund, MD [facility] Pager On Amion  If 7PM-7AM, please contact night-coverage 10/17/2020, 5:10 PM

## 2020-10-17 NOTE — Progress Notes (Signed)
Physical Therapy Treatment Patient Details Name: [name] MRN: [id number] DOB: 1940-12-31 Today's Date: 10/17/2020    History of Present Illness 81 y.o. female presented to [facility] ED  with concern for shortness of breath; COPD exacerbation  PMH--COPD with prior admissions for respiratory failure including intubation in March 2022, HFpEF, tobacco abuse, osteoporosis.    PT Comments    Pt admitted with above diagnosis. Pt was able incr ambulation without device with overall good safety. Did discuss with pt that she could use rollator for incr endurance with longer distance ambulation and pt agrees she may consider that in future. Pt uses cane in home as she has a split level home and it is best to have the cane for support given the different levels. Pt progressing well.  Pt currently with functional limitations due to balance and endurance deficits. Pt will benefit from skilled PT to increase their independence and safety with mobility to allow discharge to the venue listed below.     Follow Up Recommendations  Home health PT     Equipment Recommendations  None recommended by PT    Recommendations for Other Services OT consult     Precautions / Restrictions Precautions Precautions: Fall Restrictions Weight Bearing Restrictions: No    Mobility  Bed Mobility               General bed mobility comments: in chair on arrival    Transfers Overall transfer level: Needs assistance Equipment used: None Transfers: Sit to/from Stand Sit to Stand: Supervision;Min guard         General transfer comment: Min guard to supervision A for sit to stand transfers from various surfaces.  Ambulation/Gait Ambulation/Gait assistance: Min guard Gait Distance (Feet): 110 Feet Assistive device: None Gait Pattern/deviations: Step-through pattern;Decreased stride length;Wide base of support Gait velocity: slightly decr Gait velocity interpretation: <1.31 ft/sec, indicative of household  ambulator General Gait Details: pt occasionally reaching to touch counter or table, otherwise no UE support. Again reports feeling shakey "but probably because I haven't been up".  Pt at times incr lateral lean to right but no significant LOB   Stairs             Wheelchair Mobility    Modified Rankin (Stroke Patients Only)       Balance Overall balance assessment: Mild deficits observed, not formally tested                                          Cognition Arousal/Alertness: Awake/alert Behavior During Therapy: WFL for tasks assessed/performed Overall Cognitive Status: No family/caregiver present to determine baseline cognitive functioning                                        Exercises General Exercises - Lower Extremity Ankle Circles/Pumps: AROM;Both;10 reps;Seated Long Arc Quad: AROM;Both;10 reps;Seated Hip Flexion/Marching: AROM;Both;10 reps;Seated    General Comments General comments (skin integrity, edema, etc.): VSS on 3LO2 - pt sats >90%      Pertinent Vitals/Pain Pain Assessment: No/denies pain    Home Living                      Prior Function            PT Goals (current goals can now be  found in the care plan section) Acute Rehab PT Goals Patient Stated Goal: get better and go home Time For Goal Achievement: 10/30/20 Progress towards PT goals: Progressing toward goals    Frequency    Min 3X/week      PT Plan Current plan remains appropriate    Co-evaluation              AM-PAC PT "6 Clicks" Mobility   Outcome Measure  Help needed turning from your back to your side while in a flat bed without using bedrails?: None Help needed moving from lying on your back to sitting on the side of a flat bed without using bedrails?: A Little Help needed moving to and from a bed to a chair (including a wheelchair)?: A Little Help needed standing up from a chair using your arms (e.g., wheelchair or  bedside chair)?: A Little Help needed to walk in hospital room?: A Little Help needed climbing 3-5 steps with a railing? : A Little 6 Click Score: 19    End of Session Equipment Utilized During Treatment: Gait belt;Oxygen Activity Tolerance: Patient tolerated treatment well Patient left: in chair;with call bell/phone within reach;with chair alarm set Nurse Communication: Mobility status PT Visit Diagnosis: Unsteadiness on feet (R26.81)     Time: 5885-0277 PT Time Calculation (min) (ACUTE ONLY): 12 min  Charges:  $Gait Training: 8-22 mins                     Naftali Carchi M,PT Acute Rehab Services [phone] (pager)   Alvira Philips 10/17/2020, 2:38 PM

## 2020-10-17 NOTE — Care Management Important Message (Signed)
Important Message  Patient Details  Name: [name] MRN: [id number] Date of Birth: [birth year]   Medicare Important Message Given:  Yes     Orbie Pyo 10/17/2020, 2:03 PM

## 2020-10-18 LAB — COMPREHENSIVE METABOLIC PANEL
ALT: 14 U/L (ref 0–44)
AST: 15 U/L (ref 15–41)
Albumin: 3.4 g/dL — ABNORMAL LOW (ref 3.5–5.0)
Alkaline Phosphatase: 43 U/L (ref 38–126)
Anion gap: 6 (ref 5–15)
BUN: 22 mg/dL (ref 8–23)
CO2: 30 mmol/L (ref 22–32)
Calcium: 8.9 mg/dL (ref 8.9–10.3)
Chloride: 90 mmol/L — ABNORMAL LOW (ref 98–111)
Creatinine, Ser: 0.72 mg/dL (ref 0.44–1.00)
GFR, Estimated: >60 mL/min (ref >60)
Glucose, Bld: 82 mg/dL (ref 70–99)
Potassium: 4.3 mmol/L (ref 3.5–5.1)
Sodium: 126 mmol/L — ABNORMAL LOW (ref 135–145)
Total Bilirubin: 0.8 mg/dL (ref 0.3–1.2)
Total Protein: 6 g/dL — ABNORMAL LOW (ref 6.5–8.1)

## 2020-10-18 LAB — URINALYSIS, ROUTINE W REFLEX MICROSCOPIC
Bilirubin Urine: NEGATIVE
Glucose, UA: 150 mg/dL — AB
Ketones, ur: NEGATIVE mg/dL
Nitrite: NEGATIVE
Protein, ur: 100 mg/dL — AB
RBC / HPF: >50 RBC/hpf — ABNORMAL HIGH (ref 0–5)
Specific Gravity, Urine: 1.016 (ref 1.005–1.030)
WBC, UA: >50 WBC/hpf — ABNORMAL HIGH (ref 0–5)
pH: 7 (ref 5.0–8.0)

## 2020-10-18 LAB — OSMOLALITY: Osmolality: 279 mOsm/kg (ref 275–295)

## 2020-10-18 LAB — OSMOLALITY, URINE: Osmolality, Ur: 532 mOsm/kg (ref 300–900)

## 2020-10-18 NOTE — Progress Notes (Signed)
Occupational Therapy Treatment Patient Details Name: [name] MRN: [id number] DOB: 10-Jan-1941 Today's Date: 10/18/2020    History of present illness 80 y.o. female presented to [facility] ED  with concern for shortness of breath; COPD exacerbation  PMH--COPD with prior admissions for respiratory failure including intubation in March 2022, HFpEF, tobacco abuse, osteoporosis.   OT comments  Patient making progress toward goals. OT treatment session with focus on self-care re-education and safety with ADLs/ADL transfers. Patient completed walk-in shower transfer with supervision A, UB bathing/dressing with supervision A and LB bathing/dressing with Min A. Patient declines use of AE to maximize independence with LB bathing/dressing including donning footwear which patient notes has been difficult since L IMN in 2016. Patient also resistant to use of DME during bathing tasks with preference for standing despite education. OT will continue to follow acutely to maximize safety and independence with ADLs/IADLs in prep for safe return home.    Follow Up Recommendations  Home health OT    Equipment Recommendations  None recommended by OT    Recommendations for Other Services      Precautions / Restrictions Precautions Precautions: Fall Restrictions Weight Bearing Restrictions: No       Mobility Bed Mobility               General bed mobility comments: in chair on arrival    Transfers Overall transfer level: Needs assistance Equipment used: None Transfers: Sit to/from Stand Sit to Stand: Supervision         General transfer comment: Supervision A for safety.    Balance Overall balance assessment: Mild deficits observed, not formally tested                                         ADL either performed or assessed with clinical judgement   ADL Overall ADL's : Needs assistance/impaired                 Upper Body Dressing : Set up;Sitting;Standing Upper  Body Dressing Details (indicate cue type and reason): Donned anterior hosptial gown in standing. Lower Body Dressing: Minimal assistance;Sit to/from stand Lower Body Dressing Details (indicate cue type and reason): Requires assist to don footwear. Declines educaiton on use of AE to increase independence with LB dressing. Toilet Transfer: Copy Details (indicate cue type and reason): Supervision A for safety.         Functional mobility during ADLs: Supervision/safety General ADL Comments: Patient making progress toward goals.     Vision       Perception     Praxis      Cognition Arousal/Alertness: Awake/alert Behavior During Therapy: WFL for tasks assessed/performed Overall Cognitive Status: No family/caregiver present to determine baseline cognitive functioning                                          Exercises     Shoulder Instructions       General Comments VSS on RA.    Pertinent Vitals/ Pain       Pain Assessment: No/denies pain  Home Living  Prior Functioning/Environment              Frequency  Min 2X/week        Progress Toward Goals  OT Goals(current goals can now be found in the care plan section)  Progress towards OT goals: Progressing toward goals  Acute Rehab OT Goals Patient Stated Goal: get better and go home OT Goal Formulation: With patient Time For Goal Achievement: 10/30/20 Potential to Achieve Goals: Good ADL Goals Additional ADL Goal #1: Patient will complete ADLs and ADL transfers with Mod I and AD/DME as needed. Additional ADL Goal #2: Patient will recall 3 energy conservation techinques in prep for ADLs/IADLs.  Plan Discharge plan remains appropriate;Frequency remains appropriate    Co-evaluation                 AM-PAC OT "6 Clicks" Daily Activity     Outcome Measure   Help from another person eating meals?:  None Help from another person taking care of personal grooming?: None Help from another person toileting, which includes using toliet, bedpan, or urinal?: A Little Help from another person bathing (including washing, rinsing, drying)?: A Little Help from another person to put on and taking off regular upper body clothing?: None Help from another person to put on and taking off regular lower body clothing?: A Little 6 Click Score: 21    End of Session Equipment Utilized During Treatment: Gait belt  OT Visit Diagnosis: Unsteadiness on feet (R26.81);Muscle weakness (generalized) (M62.81)   Activity Tolerance Patient tolerated treatment well   Patient Left in chair;with call bell/phone within reach;with chair alarm set   Nurse Communication          Time: 7322-0254 OT Time Calculation (min): 31 min  Charges: OT General Charges $OT Visit: 1 Visit OT Treatments $Self Care/Home Management : 23-37 mins  Almando Brawley H. OTR/L Supplemental OT, Department of rehab services [phone]   Emanuela Runnion R H. 10/18/2020, 12:18 PM

## 2020-10-18 NOTE — Progress Notes (Signed)
PROGRESS NOTE    [name]  XHB:[id number] DOB: August 18, 1940 DOA: 10/15/2020 PCP: Cyndi Bender, PA-C    Brief Narrative:   80 y.o. female with medical history significant of COPD with prior admissions for respiratory failure, most recently August 01, 2020 when she required intubation, HFpEF, tobacco abuse, osteoporosis. She was discharged [place] 3.22.22 on Ellipta and daily lasix. She had been doing relatively well but presents to [facility] ED  with concern for shortness of breath at home. She has as needed 2 to 3 L supplemental oxygen for worsening shortness of breath secondary to COPD. EMS was called this morning due to worsening shortness of breath and she was found to be satting in the mid 80s on 3 L oxygen with respiratory rate in the 40s. There were diminished lung sounds in all lung fields. She was administered Solu-Medrol 1.5 mg as well as a DuoNeb in the ambulance with improvement in her symptoms  Assessment & Plan:   Active Problems:   HTN (hypertension)   COPD with acute exacerbation (HCC)   Pulmonary hypertension (HCC)   Tobacco abuse   Chronic right-sided CHF (congestive heart failure) ([place])   1. COPD exacerbation - patient noted to have recent hx of acute exacerbation requiring intubation and MICU care. Did well and was dischared on Ellipta and prn albuterol. Pt follows with pulmonologist in [place]. -Was continued on IV steroids, now on prednisone - Continue Ellipta -Continue abluterol MDI prn  -Cont on Duonebs q4 prn for recurrent wheezing -now on room air  2. HFpEF - previous admission March '22 with BNP >8000. Had [place] revealing Pulmonary HTN and low filling pressures. Did well with diuresis. Was discharged on daily lasix. At admission BNP 153.7, with repeat BNP of 1383 -Pt has been continued on IV lasix with good diuresis -Pt is continued on home meds including ACE-I, BB, diuretic -recheck bmet in AM  3. HTN - BP stable and controlled at this time -  Continue home meds as tolerated  4. Tobacco abuse  - Pt reportedly had not smoked since 3/22  5. Derm - patient with pruritic erythematous macular rash left back - Pt has been continued on H2 blocker for pruritis -recommend Derm consult as outpatient              6. Code status  - Admitting physician discussed with patient and spouse: has a living will. No Cardiac Resuscitation. YES for intubation for acute, reversible respiratory failure.    7. Hyponatremia -Worsening while on lasix -on exam, appears euvolemic -Urine and serum osm reviewed. -Have held subsequent lasix doses -Advise patient to self-hydrate overnight  DVT prophylaxis: Lovenox subq Code Status: DNR Family Communication: Pt in room, family not at bedside  Status is: Inpatient  Remains inpatient appropriate because:Inpatient level of care appropriate due to severity of illness   Dispo: The patient is from: Home              Anticipated d/c is to: Home              Patient currently is not medically stable to d/c.   Difficult to place patient No    Consultants:     Procedures:     Antimicrobials: Anti-infectives (From admission, onward)   None      Subjective: Eager to go home  Objective: Vitals:   10/18/20 0547 10/18/20 0800 10/18/20 0818 10/18/20 1101  BP: 95/78  127/72 96/65  Pulse: 77  97   Resp: 14  16 18  Temp: 97.7 F (36.5 C)  98.2 F (36.8 C) 98.5 F (36.9 C)  TempSrc: Oral  Oral Oral  SpO2: 91% 91% 90% 95%  Weight:      Height:        Intake/Output Summary (Last 24 hours) at 10/18/2020 1716 Last data filed at 10/18/2020 1443 Gross per 24 hour  Intake 540 ml  Output 100 ml  Net 440 ml   Filed Weights   10/15/20 1257  Weight: 65.8 kg    Examination: General exam: Awake, laying in bed, in nad Respiratory system: Normal respiratory effort, no wheezing Cardiovascular system: regular rate, s1, s2 Gastrointestinal system: Soft, nondistended, positive BS Central  nervous system: CN2-12 grossly intact, strength intact Extremities: Perfused, no clubbing Skin: Normal skin turgor, no notable skin lesions seen Psychiatry: Mood normal // no visual hallucinations   Data Reviewed: I have personally reviewed following labs and imaging studies  CBC: Recent Labs  Lab 10/15/20 1305  WBC 10.9*  NEUTROABS 8.0*  HGB 15.8*  HCT 48.7*  MCV 95.3  PLT 160   Basic Metabolic Panel: Recent Labs  Lab 10/15/20 1305 10/16/20 0241 10/17/20 0425 10/18/20 0325  NA 129* 131* 129* 126*  K 4.5 4.5 4.3 4.3  CL 89* 93* 92* 90*  CO2 28 29 33* 30  GLUCOSE 162* 162* 100* 82  BUN 12 9 17 22   CREATININE 0.71 0.56 0.77 0.72  CALCIUM 9.3 9.4 8.9 8.9   GFR: Estimated Creatinine Clearance: 51.6 mL/min (by C-G formula based on SCr of 0.72 mg/dL). Liver Function Tests: Recent Labs  Lab 10/17/20 0425 10/18/20 0325  AST 15 15  ALT 13 14  ALKPHOS 42 43  BILITOT 0.6 0.8  PROT 5.9* 6.0*  ALBUMIN 3.3* 3.4*   No results for input(s): LIPASE, AMYLASE in the last 168 hours. No results for input(s): AMMONIA in the last 168 hours. Coagulation Profile: No results for input(s): INR, PROTIME in the last 168 hours. Cardiac Enzymes: No results for input(s): CKTOTAL, CKMB, CKMBINDEX, TROPONINI in the last 168 hours. BNP (last 3 results) No results for input(s): PROBNP in the last 8760 hours. HbA1C: No results for input(s): HGBA1C in the last 72 hours. CBG: No results for input(s): GLUCAP in the last 168 hours. Lipid Profile: No results for input(s): CHOL, HDL, LDLCALC, TRIG, CHOLHDL, LDLDIRECT in the last 72 hours. Thyroid Function Tests: No results for input(s): TSH, T4TOTAL, FREET4, T3FREE, THYROIDAB in the last 72 hours. Anemia Panel: No results for input(s): VITAMINB12, FOLATE, FERRITIN, TIBC, IRON, RETICCTPCT in the last 72 hours. Sepsis Labs: No results for input(s): PROCALCITON, LATICACIDVEN in the last 168 hours.  Recent Results (from the past 240 hour(s))   Resp Panel by RT-PCR (Flu A&B, Covid) Nasopharyngeal Swab     Status: None   Collection Time: 10/15/20  1:05 PM   Specimen: Nasopharyngeal Swab; Nasopharyngeal(NP) swabs in vial transport medium  Result Value Ref Range Status   SARS Coronavirus 2 by RT PCR NEGATIVE NEGATIVE Final    Comment: (NOTE) SARS-CoV-2 target nucleic acids are NOT DETECTED.  The SARS-CoV-2 RNA is generally detectable in upper respiratory specimens during the acute phase of infection. The lowest concentration of SARS-CoV-2 viral copies this assay can detect is 138 copies/mL. A negative result does not preclude SARS-Cov-2 infection and should not be used as the sole basis for treatment or other patient management decisions. A negative result may occur with  improper specimen collection/handling, submission of specimen other than nasopharyngeal swab, presence of  viral mutation(s) within the areas targeted by this assay, and inadequate number of viral copies(<138 copies/mL). A negative result must be combined with clinical observations, patient history, and epidemiological information. The expected result is Negative.  Fact Sheet for Patients:  EntrepreneurPulse.com.au  Fact Sheet for Healthcare Providers:  IncredibleEmployment.be  This test is no t yet approved or cleared by the [place] FDA and  has been authorized for detection and/or diagnosis of SARS-CoV-2 by FDA under an Emergency Use Authorization (EUA). This EUA will remain  in effect (meaning this test can be used) for the duration of the COVID-19 declaration under Section 564(b)(1) of the Act, 21 U.S.C.section 360bbb-3(b)(1), unless the authorization is terminated  or revoked sooner.       Influenza A by PCR NEGATIVE NEGATIVE Final   Influenza B by PCR NEGATIVE NEGATIVE Final    Comment: (NOTE) The Xpert Xpress SARS-CoV-2/FLU/RSV plus assay is intended as an aid in the diagnosis of influenza from  Nasopharyngeal swab specimens and should not be used as a sole basis for treatment. Nasal washings and aspirates are unacceptable for Xpert Xpress SARS-CoV-2/FLU/RSV testing.  Fact Sheet for Patients: EntrepreneurPulse.com.au  Fact Sheet for Healthcare Providers: IncredibleEmployment.be  This test is not yet approved or cleared by the [place] FDA and has been authorized for detection and/or diagnosis of SARS-CoV-2 by FDA under an Emergency Use Authorization (EUA). This EUA will remain in effect (meaning this test can be used) for the duration of the COVID-19 declaration under Section 564(b)(1) of the Act, 21 U.S.C. section 360bbb-3(b)(1), unless the authorization is terminated or revoked.  Performed at [facility], [place] [address], [place], [place] [address]      Radiology Studies: No results found.  Scheduled Meds: . benazepril  20 mg Oral Daily  . enoxaparin (LOVENOX) injection  40 mg Subcutaneous Q24H  . famotidine  10 mg Oral BID  . fluticasone furoate-vilanterol  1 puff Inhalation Daily   And  . umeclidinium bromide  1 puff Inhalation Daily  . metoprolol tartrate  100 mg Oral Daily  . predniSONE  40 mg Oral Q breakfast  . traZODone  25 mg Oral QHS  . vitamin B-12  1,000 mcg Oral Daily   Continuous Infusions:   LOS: 3 days   Marylu Lund, MD [facility] Pager On Amion  If 7PM-7AM, please contact night-coverage 10/18/2020, 5:16 PM

## 2020-10-19 LAB — COMPREHENSIVE METABOLIC PANEL
ALT: 14 U/L (ref 0–44)
AST: 17 U/L (ref 15–41)
Albumin: 3.3 g/dL — ABNORMAL LOW (ref 3.5–5.0)
Alkaline Phosphatase: 46 U/L (ref 38–126)
Anion gap: 5 (ref 5–15)
BUN: 19 mg/dL (ref 8–23)
CO2: 30 mmol/L (ref 22–32)
Calcium: 8.8 mg/dL — ABNORMAL LOW (ref 8.9–10.3)
Chloride: 93 mmol/L — ABNORMAL LOW (ref 98–111)
Creatinine, Ser: 0.72 mg/dL (ref 0.44–1.00)
GFR, Estimated: >60 mL/min (ref >60)
Glucose, Bld: 107 mg/dL — ABNORMAL HIGH (ref 70–99)
Potassium: 4.2 mmol/L (ref 3.5–5.1)
Sodium: 128 mmol/L — ABNORMAL LOW (ref 135–145)
Total Bilirubin: 0.7 mg/dL (ref 0.3–1.2)
Total Protein: 5.7 g/dL — ABNORMAL LOW (ref 6.5–8.1)

## 2020-10-19 MED ORDER — PREDNISONE 20 MG PO TABS: 40.0000 mg | ORAL_TABLET | Freq: Every day | ORAL | 0 refills | Status: AC

## 2020-10-19 MED ORDER — FAMOTIDINE 10 MG PO TABS: 10.0000 mg | ORAL_TABLET | Freq: Two times a day (BID) | ORAL | 0 refills | Status: AC

## 2020-10-19 NOTE — Discharge Summary (Signed)
Physician Discharge Summary  [name] [id number] DOB: 1941/02/28 DOA: 10/15/2020  PCP: [name] Admit date: 10/15/2020 Discharge date: 10/19/2020  Admitted From: Home Disposition:  Home  Recommendations for Outpatient Follow-up:  1. Follow up with PCP in 1-2 weeks 2. Recommend repeat BMET within one week, focus on sodium level  Home Health:PT, OT  Equipment/Devices:    Discharge Condition:Improved CODE STATUS:DNR Diet recommendation: Heart healthy   Brief/Interim Summary: 80 y.o.femalewith medical history significant ofCOPD with prior admissions for respiratory failure, most recently August 01, 2020 when she required intubation, HFpEF, tobacco abuse, osteoporosis. She was discharged [place] 3.22.22 on Ellipta and daily lasix. She had been doing relatively well but presents to [place] concern for shortness of breath at home. She has as needed 2 to 3 L supplemental oxygen for worsening shortness of breath secondary to COPD. EMS was called this morning due to worsening shortness of breath and she was found to be satting in the mid 80s on 3Loxygen with respiratory rate in the 40s. Therewerediminished lung sounds in all lung fields. Shewasadministered Solu-Medrol 1.5 mg as well as a DuoNeb in the ambulance with improvement in her symptoms  Discharge Diagnoses:  Active Problems:   HTN (hypertension)   COPD with acute exacerbation (HCC)   Pulmonary hypertension (HCC)   Tobacco abuse   Chronic right-sided CHF (congestive heart failure) ([place])  1. COPD exacerbation present on admit - patient noted to have recent hx of acute exacerbation requiring intubation and MICU care. Did well and was dischared on Ellipta and prn albuterol. Pt follows with pulmonologist in [place]. -Was continued on IV steroids, now on prednisone -Continued on Ellipta -Continue abluterol MDI prn -Cont on Duonebs q4 prn for recurrent wheezing -weaned to room air  2. Acute on chronic  HFpEF present on admit - previous admission March '22 with BNP >8000. Had [place] revealing Pulmonary HTN and low filling pressures. Did well with diuresis. Was discharged on daily lasix. At admission BNP 153.7, with repeat BNP of 1383 -Pt has been continued on IV lasix with good diuresis -Pt is continued on home meds including ACE-I, BB, diuretic -Continue home meds on d/c  3. HTN - BP stable and controlled at this time - Continue home meds as tolerated  4. Tobacco abuse  - Pt reportedly had not smoked since 3/22  5. Derm - patient with pruritic erythematous macular rash left back - Pt has been continued onH2 blocker for pruritis -recommend Derm consult as outpatient  6. Code status  - Admitting physician discussed with patient and spouse: has a living will. No Cardiac Resuscitation. YES for intubation for acute, reversible respiratory failure.  7. Acute on chronic Hyponatremia -Chart reviewed. Pt has hx of chronic hyponatremia with Na around 129-130 -Sodium trended down to 126 while on lasix -Lasix was briefly held and sodium trended up overnight to 128 -Patient remained stable -Recommend close outpatient follow up with repeat bmet in one week   Discharge Instructions   Allergies as of 10/19/2020      Reactions   Codeine Hives   Penicillins Other (See Comments)   "unknown"      Medication List    TAKE these medications   acetaminophen 325 MG tablet Commonly known as: TYLENOL Take 650 mg by mouth every 6 (six) hours as needed for mild pain.   albuterol 108 (90 Base) MCG/ACT inhaler Commonly known as: VENTOLIN HFA Inhale 2 puffs into the lungs every 4 (four) hours as needed for wheezing or  shortness of breath.   alendronate 70 MG tablet Commonly known as: FOSAMAX Take 70 mg by mouth once a week.   benazepril 20 MG tablet Commonly known as: LOTENSIN Take 20 mg by mouth daily.   calcium-vitamin D 500-200 MG-UNIT tablet Commonly known as: OSCAL  WITH D Take 1 tablet by mouth daily with breakfast.   famotidine 10 MG tablet Commonly known as: PEPCID Take 1 tablet (10 mg total) by mouth 2 (two) times daily.   furosemide 20 MG tablet Commonly known as: LASIX Take 20 mg by mouth daily.   metoprolol tartrate 100 MG tablet Commonly known as: LOPRESSOR Take 100 mg by mouth daily.   predniSONE 20 MG tablet Commonly known as: DELTASONE Take 2 tablets (40 mg total) by mouth daily with breakfast for 2 days. Start taking on: Oct 20, 2020   Trelegy Ellipta 200-62.5-25 MCG/INH Aepb Generic drug: Fluticasone-Umeclidin-Vilant Inhale 1 puff into the lungs daily.   vitamin B-12 1000 MCG tablet Commonly known as: CYANOCOBALAMIN Take 1,000 mcg by mouth daily.       Follow-up Information    [place] [place] Follow up.   Specialty: Home Health Services Why: Your home health services have been set up with [facility]. The office will call you with start of service information.  Contact information: [place] [address] [phone]        Stokes, Jamie Mercy, MD. Schedule an appointment as soon as possible for a visit in 1 week(s).   Specialty: Family Medicine Contact information: [place] [address] [phone]              Allergies  Allergen Reactions  . Codeine Hives  . Penicillins Other (See Comments)    "unknown"    Procedures/Studies: DG Chest Port 1 View  Result Date: 10/15/2020 CLINICAL DATA:  Dyspnea. EXAM: PORTABLE CHEST 1 VIEW COMPARISON:  August 01, 2020. FINDINGS: The heart size and mediastinal contours are within normal limits. Both lungs are clear. The visualized skeletal structures are unremarkable. IMPRESSION: No active disease. Electronically Signed   By: [name] M.D.   On: 10/15/2020 14:33     Subjective: Eager to go home  Discharge Exam: Vitals:   10/19/20 0900 10/19/20 1216  BP: (!) 145/84 121/75  Pulse: 97 66  Resp: 18    Temp: 97.8 F (36.6 C) 98.5 F (36.9 C)  SpO2: 93% 96%   Vitals:   10/19/20 0613 10/19/20 0732 10/19/20 0900 10/19/20 1216  BP: (!) 145/70  (!) 145/84 121/75  Pulse: 79  97 66  Resp: 18  18   Temp: 97.6 F (36.4 C)  97.8 F (36.6 C) 98.5 F (36.9 C)  TempSrc: Oral  Oral   SpO2: 93% 96% 93% 96%  Weight:      Height:        General: Pt is alert, awake, not in acute distress Cardiovascular: RRR, S1/S2 + Respiratory: CTA bilaterally, no wheezing, no rhonchi Abdominal: Soft, NT, ND, bowel sounds + Extremities: no edema, no cyanosis   The results of significant diagnostics from this hospitalization (including imaging, microbiology, ancillary and laboratory) are listed below for reference.     Microbiology: Recent Results (from the past 240 hour(s))  Resp Panel by RT-PCR (Flu A&B, Covid) Nasopharyngeal Swab     Status: None   Collection Time: 10/15/20  1:05 PM   Specimen: Nasopharyngeal Swab; Nasopharyngeal(NP) swabs in vial transport medium  Result Value Ref Range Status  SARS Coronavirus 2 by RT PCR NEGATIVE NEGATIVE Final    Comment: (NOTE) SARS-CoV-2 target nucleic acids are NOT DETECTED.  The SARS-CoV-2 RNA is generally detectable in upper respiratory specimens during the acute phase of infection. The lowest concentration of SARS-CoV-2 viral copies this assay can detect is 138 copies/mL. A negative result does not preclude SARS-Cov-2 infection and should not be used as the sole basis for treatment or other patient management decisions. A negative result may occur with  improper specimen collection/handling, submission of specimen other than nasopharyngeal swab, presence of viral mutation(s) within the areas targeted by this assay, and inadequate number of viral copies(<138 copies/mL). A negative result must be combined with clinical observations, patient history, and epidemiological information. The expected result is Negative.  Fact Sheet for Patients:   EntrepreneurPulse.com.au  Fact Sheet for Healthcare Providers:  IncredibleEmployment.be  This test is no t yet approved or cleared by the [place] FDA and  has been authorized for detection and/or diagnosis of SARS-CoV-2 by FDA under an Emergency Use Authorization (EUA). This EUA will remain  in effect (meaning this test can be used) for the duration of the COVID-19 declaration under Section 564(b)(1) of the Act, 21 U.S.C.section 360bbb-3(b)(1), unless the authorization is terminated  or revoked sooner.       Influenza A by PCR NEGATIVE NEGATIVE Final   Influenza B by PCR NEGATIVE NEGATIVE Final    Comment: (NOTE) The Xpert Xpress SARS-CoV-2/FLU/RSV plus assay is intended as an aid in the diagnosis of influenza from Nasopharyngeal swab specimens and should not be used as a sole basis for treatment. Nasal washings and aspirates are unacceptable for Xpert Xpress SARS-CoV-2/FLU/RSV testing.  Fact Sheet for Patients: EntrepreneurPulse.com.au  Fact Sheet for Healthcare Providers: IncredibleEmployment.be  This test is not yet approved or cleared by the [place] FDA and has been authorized for detection and/or diagnosis of SARS-CoV-2 by FDA under an Emergency Use Authorization (EUA). This EUA will remain in effect (meaning this test can be used) for the duration of the COVID-19 declaration under Section 564(b)(1) of the Act, 21 U.S.C. section 360bbb-3(b)(1), unless the authorization is terminated or revoked.  Performed at [facility], [place] [address] [place], [place] [address]      Labs: BNP (last 3 results) Recent Labs    10/15/20 1305 10/16/20 0241  BNP 153.7* 1,324.4*   Basic Metabolic Panel: Recent Labs  Lab 10/15/20 1305 10/16/20 0241 10/17/20 0425 10/18/20 0325 10/19/20 0106  NA 129* 131* 129* 126* 128*  K 4.5 4.5 4.3 4.3 4.2  CL 89* 93* 92* 90* 93*  CO2 28 29 33* 30  30  GLUCOSE 162* 162* 100* 82 107*  BUN 12 9 17 22 19   CREATININE 0.71 0.56 0.77 0.72 0.72  CALCIUM 9.3 9.4 8.9 8.9 8.8*   Liver Function Tests: Recent Labs  Lab 10/17/20 0425 10/18/20 0325 10/19/20 0106  AST 15 15 17   ALT 13 14 14   ALKPHOS 42 43 46  BILITOT 0.6 0.8 0.7  PROT 5.9* 6.0* 5.7*  ALBUMIN 3.3* 3.4* 3.3*   No results for input(s): LIPASE, AMYLASE in the last 168 hours. No results for input(s): AMMONIA in the last 168 hours. CBC: Recent Labs  Lab 10/15/20 1305  WBC 10.9*  NEUTROABS 8.0*  HGB 15.8*  HCT 48.7*  MCV 95.3  PLT 281   Cardiac Enzymes: No results for input(s): CKTOTAL, CKMB, CKMBINDEX, TROPONINI in the last 168 hours. BNP: Invalid input(s): POCBNP CBG: No results for input(s):  GLUCAP in the last 168 hours. D-Dimer No results for input(s): DDIMER in the last 72 hours. Hgb A1c No results for input(s): HGBA1C in the last 72 hours. Lipid Profile No results for input(s): CHOL, HDL, LDLCALC, TRIG, CHOLHDL, LDLDIRECT in the last 72 hours. Thyroid function studies No results for input(s): TSH, T4TOTAL, T3FREE, THYROIDAB in the last 72 hours.  Invalid input(s): FREET3 Anemia work up No results for input(s): VITAMINB12, FOLATE, FERRITIN, TIBC, IRON, RETICCTPCT in the last 72 hours. Urinalysis    Component Value Date/Time   COLORURINE YELLOW 10/18/2020 1224   APPEARANCEUR HAZY (A) 10/18/2020 1224   LABSPEC 1.016 10/18/2020 1224   PHURINE 7.0 10/18/2020 1224   GLUCOSEU 150 (A) 10/18/2020 1224   HGBUR MODERATE (A) 10/18/2020 1224   BILIRUBINUR NEGATIVE 10/18/2020 1224   KETONESUR NEGATIVE 10/18/2020 1224   PROTEINUR 100 (A) 10/18/2020 1224   UROBILINOGEN 0.2 01/04/2015 1540   NITRITE NEGATIVE 10/18/2020 1224   LEUKOCYTESUR TRACE (A) 10/18/2020 1224   Sepsis Labs Invalid input(s): PROCALCITONIN,  WBC,  LACTICIDVEN Microbiology Recent Results (from the past 240 hour(s))  Resp Panel by RT-PCR (Flu A&B, Covid) Nasopharyngeal Swab     Status:  None   Collection Time: 10/15/20  1:05 PM   Specimen: Nasopharyngeal Swab; Nasopharyngeal(NP) swabs in vial transport medium  Result Value Ref Range Status   SARS Coronavirus 2 by RT PCR NEGATIVE NEGATIVE Final    Comment: (NOTE) SARS-CoV-2 target nucleic acids are NOT DETECTED.  The SARS-CoV-2 RNA is generally detectable in upper respiratory specimens during the acute phase of infection. The lowest concentration of SARS-CoV-2 viral copies this assay can detect is 138 copies/mL. A negative result does not preclude SARS-Cov-2 infection and should not be used as the sole basis for treatment or other patient management decisions. A negative result may occur with  improper specimen collection/handling, submission of specimen other than nasopharyngeal swab, presence of viral mutation(s) within the areas targeted by this assay, and inadequate number of viral copies(<138 copies/mL). A negative result must be combined with clinical observations, patient history, and epidemiological information. The expected result is Negative.  Fact Sheet for Patients:  EntrepreneurPulse.com.au  Fact Sheet for Healthcare Providers:  IncredibleEmployment.be  This test is no t yet approved or cleared by the [place] FDA and  has been authorized for detection and/or diagnosis of SARS-CoV-2 by FDA under an Emergency Use Authorization (EUA). This EUA will remain  in effect (meaning this test can be used) for the duration of the COVID-19 declaration under Section 564(b)(1) of the Act, 21 U.S.C.section 360bbb-3(b)(1), unless the authorization is terminated  or revoked sooner.       Influenza A by PCR NEGATIVE NEGATIVE Final   Influenza B by PCR NEGATIVE NEGATIVE Final    Comment: (NOTE) The Xpert Xpress SARS-CoV-2/FLU/RSV plus assay is intended as an aid in the diagnosis of influenza from Nasopharyngeal swab specimens and should not be used as a sole basis for  treatment. Nasal washings and aspirates are unacceptable for Xpert Xpress SARS-CoV-2/FLU/RSV testing.  Fact Sheet for Patients: EntrepreneurPulse.com.au  Fact Sheet for Healthcare Providers: IncredibleEmployment.be  This test is not yet approved or cleared by the [place] FDA and has been authorized for detection and/or diagnosis of SARS-CoV-2 by FDA under an Emergency Use Authorization (EUA). This EUA will remain in effect (meaning this test can be used) for the duration of the COVID-19 declaration under Section 564(b)(1) of the Act, 21 U.S.C. section 360bbb-3(b)(1), unless the authorization is terminated or revoked.  Performed at [facility], [place] [address], [place], [place] [address]    Time spent: 12min  SIGNED:   Marylu Lund, MD  [facility] 10/19/2020, 1:09 PM  If 7PM-7AM, please contact night-coverage

## 2020-10-19 NOTE — TOC Transition Note (Signed)
Transition of Care [facility]) - CM/SW Discharge Note   Patient Details  Name: [name] MRN: [id number] Date of Birth: 06-Jun-1940  [facility] CM/SW Contact:  Carles Collet, RN Phone Number: 10/19/2020, 1:49 PM   Clinical Narrative:   Notified [facility] that patient will DC today                                          Social Determinants of Health (SDOH) Interventions     Readmission Risk Interventions No flowsheet data found.

## 2020-10-22 ENCOUNTER — Telehealth: Payer: Self-pay

## 2020-10-22 NOTE — Telephone Encounter (Signed)
Notified by Iris CMA that [name] was unable to staff for home health services. Spoke w [facility] liaison who has now arranged home health services through [facility], and will call and update that home health services with [facility] will start in 1-2 days.

## 2020-10-26 DIAGNOSIS — M1990 Unspecified osteoarthritis, unspecified site: Secondary | ICD-10-CM | POA: Diagnosis not present

## 2020-10-26 DIAGNOSIS — L538 Other specified erythematous conditions: Secondary | ICD-10-CM | POA: Diagnosis not present

## 2020-10-26 DIAGNOSIS — Z87891 Personal history of nicotine dependence: Secondary | ICD-10-CM | POA: Diagnosis not present

## 2020-10-26 DIAGNOSIS — J441 Chronic obstructive pulmonary disease with (acute) exacerbation: Secondary | ICD-10-CM | POA: Diagnosis not present

## 2020-10-26 DIAGNOSIS — M810 Age-related osteoporosis without current pathological fracture: Secondary | ICD-10-CM | POA: Diagnosis not present

## 2020-10-26 DIAGNOSIS — E049 Nontoxic goiter, unspecified: Secondary | ICD-10-CM | POA: Diagnosis not present

## 2020-10-26 DIAGNOSIS — Z7951 Long term (current) use of inhaled steroids: Secondary | ICD-10-CM | POA: Diagnosis not present

## 2020-10-26 DIAGNOSIS — K5900 Constipation, unspecified: Secondary | ICD-10-CM | POA: Diagnosis not present

## 2020-10-26 DIAGNOSIS — F32A Depression, unspecified: Secondary | ICD-10-CM | POA: Diagnosis not present

## 2020-10-26 DIAGNOSIS — I5033 Acute on chronic diastolic (congestive) heart failure: Secondary | ICD-10-CM | POA: Diagnosis not present

## 2020-10-26 DIAGNOSIS — I110 Hypertensive heart disease with heart failure: Secondary | ICD-10-CM | POA: Diagnosis not present

## 2020-10-26 DIAGNOSIS — Z9981 Dependence on supplemental oxygen: Secondary | ICD-10-CM | POA: Diagnosis not present

## 2020-10-26 DIAGNOSIS — E785 Hyperlipidemia, unspecified: Secondary | ICD-10-CM | POA: Diagnosis not present

## 2020-10-26 DIAGNOSIS — I272 Pulmonary hypertension, unspecified: Secondary | ICD-10-CM | POA: Diagnosis not present

## 2020-10-26 DIAGNOSIS — E039 Hypothyroidism, unspecified: Secondary | ICD-10-CM | POA: Diagnosis not present

## 2020-10-26 DIAGNOSIS — J440 Chronic obstructive pulmonary disease with acute lower respiratory infection: Secondary | ICD-10-CM | POA: Diagnosis not present

## 2020-10-26 DIAGNOSIS — R69 Illness, unspecified: Secondary | ICD-10-CM | POA: Diagnosis not present

## 2020-10-26 DIAGNOSIS — I5082 Biventricular heart failure: Secondary | ICD-10-CM | POA: Diagnosis not present

## 2020-10-31 DIAGNOSIS — E049 Nontoxic goiter, unspecified: Secondary | ICD-10-CM | POA: Diagnosis not present

## 2020-10-31 DIAGNOSIS — K5900 Constipation, unspecified: Secondary | ICD-10-CM | POA: Diagnosis not present

## 2020-10-31 DIAGNOSIS — I110 Hypertensive heart disease with heart failure: Secondary | ICD-10-CM | POA: Diagnosis not present

## 2020-10-31 DIAGNOSIS — R69 Illness, unspecified: Secondary | ICD-10-CM | POA: Diagnosis not present

## 2020-10-31 DIAGNOSIS — F32A Depression, unspecified: Secondary | ICD-10-CM | POA: Diagnosis not present

## 2020-10-31 DIAGNOSIS — L538 Other specified erythematous conditions: Secondary | ICD-10-CM | POA: Diagnosis not present

## 2020-10-31 DIAGNOSIS — I5033 Acute on chronic diastolic (congestive) heart failure: Secondary | ICD-10-CM | POA: Diagnosis not present

## 2020-10-31 DIAGNOSIS — I5082 Biventricular heart failure: Secondary | ICD-10-CM | POA: Diagnosis not present

## 2020-10-31 DIAGNOSIS — E785 Hyperlipidemia, unspecified: Secondary | ICD-10-CM | POA: Diagnosis not present

## 2020-10-31 DIAGNOSIS — I272 Pulmonary hypertension, unspecified: Secondary | ICD-10-CM | POA: Diagnosis not present

## 2020-10-31 DIAGNOSIS — J441 Chronic obstructive pulmonary disease with (acute) exacerbation: Secondary | ICD-10-CM | POA: Diagnosis not present

## 2020-10-31 DIAGNOSIS — M810 Age-related osteoporosis without current pathological fracture: Secondary | ICD-10-CM | POA: Diagnosis not present

## 2020-10-31 DIAGNOSIS — Z9981 Dependence on supplemental oxygen: Secondary | ICD-10-CM | POA: Diagnosis not present

## 2020-10-31 DIAGNOSIS — Z7951 Long term (current) use of inhaled steroids: Secondary | ICD-10-CM | POA: Diagnosis not present

## 2020-10-31 DIAGNOSIS — Z87891 Personal history of nicotine dependence: Secondary | ICD-10-CM | POA: Diagnosis not present

## 2020-10-31 DIAGNOSIS — M1990 Unspecified osteoarthritis, unspecified site: Secondary | ICD-10-CM | POA: Diagnosis not present

## 2020-10-31 DIAGNOSIS — E039 Hypothyroidism, unspecified: Secondary | ICD-10-CM | POA: Diagnosis not present

## 2020-11-03 DIAGNOSIS — I5033 Acute on chronic diastolic (congestive) heart failure: Secondary | ICD-10-CM | POA: Diagnosis not present

## 2020-11-03 DIAGNOSIS — L538 Other specified erythematous conditions: Secondary | ICD-10-CM | POA: Diagnosis not present

## 2020-11-03 DIAGNOSIS — I272 Pulmonary hypertension, unspecified: Secondary | ICD-10-CM | POA: Diagnosis not present

## 2020-11-03 DIAGNOSIS — F32A Depression, unspecified: Secondary | ICD-10-CM | POA: Diagnosis not present

## 2020-11-03 DIAGNOSIS — Z7689 Persons encountering health services in other specified circumstances: Secondary | ICD-10-CM | POA: Diagnosis not present

## 2020-11-03 DIAGNOSIS — J441 Chronic obstructive pulmonary disease with (acute) exacerbation: Secondary | ICD-10-CM | POA: Diagnosis not present

## 2020-11-03 DIAGNOSIS — E039 Hypothyroidism, unspecified: Secondary | ICD-10-CM | POA: Diagnosis not present

## 2020-11-03 DIAGNOSIS — E049 Nontoxic goiter, unspecified: Secondary | ICD-10-CM | POA: Diagnosis not present

## 2020-11-03 DIAGNOSIS — M1990 Unspecified osteoarthritis, unspecified site: Secondary | ICD-10-CM | POA: Diagnosis not present

## 2020-11-03 DIAGNOSIS — Z7951 Long term (current) use of inhaled steroids: Secondary | ICD-10-CM | POA: Diagnosis not present

## 2020-11-03 DIAGNOSIS — I5082 Biventricular heart failure: Secondary | ICD-10-CM | POA: Diagnosis not present

## 2020-11-03 DIAGNOSIS — R69 Illness, unspecified: Secondary | ICD-10-CM | POA: Diagnosis not present

## 2020-11-03 DIAGNOSIS — Z6829 Body mass index (BMI) 29.0-29.9, adult: Secondary | ICD-10-CM | POA: Diagnosis not present

## 2020-11-03 DIAGNOSIS — Z9981 Dependence on supplemental oxygen: Secondary | ICD-10-CM | POA: Diagnosis not present

## 2020-11-03 DIAGNOSIS — L282 Other prurigo: Secondary | ICD-10-CM | POA: Diagnosis not present

## 2020-11-03 DIAGNOSIS — K5900 Constipation, unspecified: Secondary | ICD-10-CM | POA: Diagnosis not present

## 2020-11-03 DIAGNOSIS — Z87891 Personal history of nicotine dependence: Secondary | ICD-10-CM | POA: Diagnosis not present

## 2020-11-03 DIAGNOSIS — I110 Hypertensive heart disease with heart failure: Secondary | ICD-10-CM | POA: Diagnosis not present

## 2020-11-03 DIAGNOSIS — Z79899 Other long term (current) drug therapy: Secondary | ICD-10-CM | POA: Diagnosis not present

## 2020-11-03 DIAGNOSIS — M810 Age-related osteoporosis without current pathological fracture: Secondary | ICD-10-CM | POA: Diagnosis not present

## 2020-11-03 DIAGNOSIS — I509 Heart failure, unspecified: Secondary | ICD-10-CM | POA: Diagnosis not present

## 2020-11-03 DIAGNOSIS — Z9181 History of falling: Secondary | ICD-10-CM | POA: Diagnosis not present

## 2020-11-03 DIAGNOSIS — E785 Hyperlipidemia, unspecified: Secondary | ICD-10-CM | POA: Diagnosis not present

## 2020-11-03 DIAGNOSIS — E871 Hypo-osmolality and hyponatremia: Secondary | ICD-10-CM | POA: Diagnosis not present

## 2020-11-05 DIAGNOSIS — Z87891 Personal history of nicotine dependence: Secondary | ICD-10-CM | POA: Diagnosis not present

## 2020-11-05 DIAGNOSIS — Z7951 Long term (current) use of inhaled steroids: Secondary | ICD-10-CM | POA: Diagnosis not present

## 2020-11-05 DIAGNOSIS — M810 Age-related osteoporosis without current pathological fracture: Secondary | ICD-10-CM | POA: Diagnosis not present

## 2020-11-05 DIAGNOSIS — E049 Nontoxic goiter, unspecified: Secondary | ICD-10-CM | POA: Diagnosis not present

## 2020-11-05 DIAGNOSIS — L538 Other specified erythematous conditions: Secondary | ICD-10-CM | POA: Diagnosis not present

## 2020-11-05 DIAGNOSIS — F32A Depression, unspecified: Secondary | ICD-10-CM | POA: Diagnosis not present

## 2020-11-05 DIAGNOSIS — I272 Pulmonary hypertension, unspecified: Secondary | ICD-10-CM | POA: Diagnosis not present

## 2020-11-05 DIAGNOSIS — M1990 Unspecified osteoarthritis, unspecified site: Secondary | ICD-10-CM | POA: Diagnosis not present

## 2020-11-05 DIAGNOSIS — J441 Chronic obstructive pulmonary disease with (acute) exacerbation: Secondary | ICD-10-CM | POA: Diagnosis not present

## 2020-11-05 DIAGNOSIS — Z9981 Dependence on supplemental oxygen: Secondary | ICD-10-CM | POA: Diagnosis not present

## 2020-11-05 DIAGNOSIS — K5900 Constipation, unspecified: Secondary | ICD-10-CM | POA: Diagnosis not present

## 2020-11-05 DIAGNOSIS — I5082 Biventricular heart failure: Secondary | ICD-10-CM | POA: Diagnosis not present

## 2020-11-05 DIAGNOSIS — R69 Illness, unspecified: Secondary | ICD-10-CM | POA: Diagnosis not present

## 2020-11-05 DIAGNOSIS — E039 Hypothyroidism, unspecified: Secondary | ICD-10-CM | POA: Diagnosis not present

## 2020-11-05 DIAGNOSIS — I110 Hypertensive heart disease with heart failure: Secondary | ICD-10-CM | POA: Diagnosis not present

## 2020-11-05 DIAGNOSIS — E785 Hyperlipidemia, unspecified: Secondary | ICD-10-CM | POA: Diagnosis not present

## 2020-11-05 DIAGNOSIS — I5033 Acute on chronic diastolic (congestive) heart failure: Secondary | ICD-10-CM | POA: Diagnosis not present

## 2020-11-13 DIAGNOSIS — I5082 Biventricular heart failure: Secondary | ICD-10-CM | POA: Diagnosis not present

## 2020-11-13 DIAGNOSIS — I110 Hypertensive heart disease with heart failure: Secondary | ICD-10-CM | POA: Diagnosis not present

## 2020-11-13 DIAGNOSIS — J441 Chronic obstructive pulmonary disease with (acute) exacerbation: Secondary | ICD-10-CM | POA: Diagnosis not present

## 2020-11-13 DIAGNOSIS — M810 Age-related osteoporosis without current pathological fracture: Secondary | ICD-10-CM | POA: Diagnosis not present

## 2020-11-13 DIAGNOSIS — Z9981 Dependence on supplemental oxygen: Secondary | ICD-10-CM | POA: Diagnosis not present

## 2020-11-13 DIAGNOSIS — R69 Illness, unspecified: Secondary | ICD-10-CM | POA: Diagnosis not present

## 2020-11-13 DIAGNOSIS — Z87891 Personal history of nicotine dependence: Secondary | ICD-10-CM | POA: Diagnosis not present

## 2020-11-13 DIAGNOSIS — E039 Hypothyroidism, unspecified: Secondary | ICD-10-CM | POA: Diagnosis not present

## 2020-11-13 DIAGNOSIS — I5033 Acute on chronic diastolic (congestive) heart failure: Secondary | ICD-10-CM | POA: Diagnosis not present

## 2020-11-13 DIAGNOSIS — F32A Depression, unspecified: Secondary | ICD-10-CM | POA: Diagnosis not present

## 2020-11-13 DIAGNOSIS — L538 Other specified erythematous conditions: Secondary | ICD-10-CM | POA: Diagnosis not present

## 2020-11-13 DIAGNOSIS — E785 Hyperlipidemia, unspecified: Secondary | ICD-10-CM | POA: Diagnosis not present

## 2020-11-13 DIAGNOSIS — K5900 Constipation, unspecified: Secondary | ICD-10-CM | POA: Diagnosis not present

## 2020-11-13 DIAGNOSIS — I272 Pulmonary hypertension, unspecified: Secondary | ICD-10-CM | POA: Diagnosis not present

## 2020-11-13 DIAGNOSIS — Z7951 Long term (current) use of inhaled steroids: Secondary | ICD-10-CM | POA: Diagnosis not present

## 2020-11-13 DIAGNOSIS — E049 Nontoxic goiter, unspecified: Secondary | ICD-10-CM | POA: Diagnosis not present

## 2020-11-13 DIAGNOSIS — M1990 Unspecified osteoarthritis, unspecified site: Secondary | ICD-10-CM | POA: Diagnosis not present

## 2020-11-14 DIAGNOSIS — I272 Pulmonary hypertension, unspecified: Secondary | ICD-10-CM | POA: Diagnosis not present

## 2020-11-14 DIAGNOSIS — J441 Chronic obstructive pulmonary disease with (acute) exacerbation: Secondary | ICD-10-CM | POA: Diagnosis not present

## 2020-11-18 ENCOUNTER — Emergency Department (HOSPITAL_COMMUNITY)
Admission: EM | Admit: 2020-11-18 | Discharge: 2020-11-19 | Disposition: A | Discharge status: Home / Self Care | Payer: Medicare HMO | Attending: Emergency Medicine | Admitting: Emergency Medicine

## 2020-11-18 ENCOUNTER — Emergency Department (HOSPITAL_COMMUNITY): Payer: Medicare HMO

## 2020-11-18 DIAGNOSIS — I509 Heart failure, unspecified: Secondary | ICD-10-CM | POA: Diagnosis not present

## 2020-11-18 DIAGNOSIS — R069 Unspecified abnormalities of breathing: Secondary | ICD-10-CM | POA: Diagnosis not present

## 2020-11-18 DIAGNOSIS — Z7951 Long term (current) use of inhaled steroids: Secondary | ICD-10-CM | POA: Insufficient documentation

## 2020-11-18 DIAGNOSIS — I5033 Acute on chronic diastolic (congestive) heart failure: Secondary | ICD-10-CM | POA: Diagnosis not present

## 2020-11-18 DIAGNOSIS — I5082 Biventricular heart failure: Secondary | ICD-10-CM | POA: Diagnosis not present

## 2020-11-18 DIAGNOSIS — I110 Hypertensive heart disease with heart failure: Secondary | ICD-10-CM | POA: Insufficient documentation

## 2020-11-18 DIAGNOSIS — I700 Atherosclerosis of aorta: Secondary | ICD-10-CM | POA: Diagnosis not present

## 2020-11-18 DIAGNOSIS — R69 Illness, unspecified: Secondary | ICD-10-CM | POA: Diagnosis not present

## 2020-11-18 DIAGNOSIS — R0602 Shortness of breath: Secondary | ICD-10-CM | POA: Diagnosis not present

## 2020-11-18 DIAGNOSIS — R059 Cough, unspecified: Secondary | ICD-10-CM | POA: Diagnosis not present

## 2020-11-18 DIAGNOSIS — R079 Chest pain, unspecified: Secondary | ICD-10-CM | POA: Insufficient documentation

## 2020-11-18 DIAGNOSIS — E039 Hypothyroidism, unspecified: Secondary | ICD-10-CM | POA: Diagnosis not present

## 2020-11-18 DIAGNOSIS — M1990 Unspecified osteoarthritis, unspecified site: Secondary | ICD-10-CM | POA: Diagnosis not present

## 2020-11-18 DIAGNOSIS — J984 Other disorders of lung: Secondary | ICD-10-CM | POA: Diagnosis not present

## 2020-11-18 DIAGNOSIS — J449 Chronic obstructive pulmonary disease, unspecified: Secondary | ICD-10-CM | POA: Diagnosis not present

## 2020-11-18 DIAGNOSIS — R0902 Hypoxemia: Secondary | ICD-10-CM | POA: Diagnosis not present

## 2020-11-18 DIAGNOSIS — I272 Pulmonary hypertension, unspecified: Secondary | ICD-10-CM | POA: Diagnosis not present

## 2020-11-18 DIAGNOSIS — E785 Hyperlipidemia, unspecified: Secondary | ICD-10-CM | POA: Diagnosis not present

## 2020-11-18 DIAGNOSIS — R000 Tachycardia, unspecified: Secondary | ICD-10-CM | POA: Diagnosis not present

## 2020-11-18 DIAGNOSIS — Z79899 Other long term (current) drug therapy: Secondary | ICD-10-CM | POA: Insufficient documentation

## 2020-11-18 DIAGNOSIS — Z87891 Personal history of nicotine dependence: Secondary | ICD-10-CM | POA: Diagnosis not present

## 2020-11-18 DIAGNOSIS — F17210 Nicotine dependence, cigarettes, uncomplicated: Secondary | ICD-10-CM | POA: Insufficient documentation

## 2020-11-18 DIAGNOSIS — J441 Chronic obstructive pulmonary disease with (acute) exacerbation: Secondary | ICD-10-CM | POA: Diagnosis not present

## 2020-11-18 DIAGNOSIS — I2510 Atherosclerotic heart disease of native coronary artery without angina pectoris: Secondary | ICD-10-CM | POA: Diagnosis not present

## 2020-11-18 DIAGNOSIS — K5900 Constipation, unspecified: Secondary | ICD-10-CM | POA: Diagnosis not present

## 2020-11-18 DIAGNOSIS — M5134 Other intervertebral disc degeneration, thoracic region: Secondary | ICD-10-CM | POA: Diagnosis not present

## 2020-11-18 DIAGNOSIS — Z743 Need for continuous supervision: Secondary | ICD-10-CM | POA: Diagnosis not present

## 2020-11-18 DIAGNOSIS — Z20822 Contact with and (suspected) exposure to covid-19: Secondary | ICD-10-CM | POA: Diagnosis not present

## 2020-11-18 DIAGNOSIS — M810 Age-related osteoporosis without current pathological fracture: Secondary | ICD-10-CM | POA: Diagnosis not present

## 2020-11-18 DIAGNOSIS — Z9981 Dependence on supplemental oxygen: Secondary | ICD-10-CM | POA: Diagnosis not present

## 2020-11-18 DIAGNOSIS — L538 Other specified erythematous conditions: Secondary | ICD-10-CM | POA: Diagnosis not present

## 2020-11-18 DIAGNOSIS — R0689 Other abnormalities of breathing: Secondary | ICD-10-CM | POA: Diagnosis not present

## 2020-11-18 DIAGNOSIS — F32A Depression, unspecified: Secondary | ICD-10-CM | POA: Diagnosis not present

## 2020-11-18 DIAGNOSIS — E049 Nontoxic goiter, unspecified: Secondary | ICD-10-CM | POA: Diagnosis not present

## 2020-11-18 LAB — CBC WITH DIFFERENTIAL/PLATELET
Abs Immature Granulocytes: 0.04 10*3/uL (ref 0.00–0.07)
Basophils Absolute: 0.1 10*3/uL (ref 0.0–0.1)
Basophils Relative: 1 %
Eosinophils Absolute: 0.1 10*3/uL (ref 0.0–0.5)
Eosinophils Relative: 1 %
HCT: 45.6 % (ref 36.0–46.0)
Hemoglobin: 14.9 g/dL (ref 12.0–15.0)
Immature Granulocytes: 0 %
Lymphocytes Relative: 26 %
Lymphs Abs: 2.4 10*3/uL (ref 0.7–4.0)
MCH: 30.6 pg (ref 26.0–34.0)
MCHC: 32.7 g/dL (ref 30.0–36.0)
MCV: 93.6 fL (ref 80.0–100.0)
Monocytes Absolute: 1 10*3/uL (ref 0.1–1.0)
Monocytes Relative: 12 %
Neutro Abs: 5.4 10*3/uL (ref 1.7–7.7)
Neutrophils Relative %: 60 %
Platelets: 289 10*3/uL (ref 150–400)
RBC: 4.87 MIL/uL (ref 3.87–5.11)
RDW: 12.9 % (ref 11.5–15.5)
WBC: 9 10*3/uL (ref 4.0–10.5)
nRBC: 0 % (ref 0.0–0.2)

## 2020-11-18 LAB — COMPREHENSIVE METABOLIC PANEL
ALT: 10 U/L (ref 0–44)
AST: 12 U/L — ABNORMAL LOW (ref 15–41)
Albumin: 3.5 g/dL (ref 3.5–5.0)
Alkaline Phosphatase: 60 U/L (ref 38–126)
Anion gap: 13 (ref 5–15)
BUN: 14 mg/dL (ref 8–23)
CO2: 26 mmol/L (ref 22–32)
Calcium: 9.5 mg/dL (ref 8.9–10.3)
Chloride: 91 mmol/L — ABNORMAL LOW (ref 98–111)
Creatinine, Ser: 0.94 mg/dL (ref 0.44–1.00)
GFR, Estimated: >60 mL/min (ref >60)
Glucose, Bld: 156 mg/dL — ABNORMAL HIGH (ref 70–99)
Potassium: 3.8 mmol/L (ref 3.5–5.1)
Sodium: 130 mmol/L — ABNORMAL LOW (ref 135–145)
Total Bilirubin: 0.1 mg/dL — ABNORMAL LOW (ref 0.3–1.2)
Total Protein: 6.8 g/dL (ref 6.5–8.1)

## 2020-11-18 LAB — TROPONIN I (HIGH SENSITIVITY): Troponin I (High Sensitivity): 8 ng/L (ref <18)

## 2020-11-18 LAB — BRAIN NATRIURETIC PEPTIDE: B Natriuretic Peptide: 40 pg/mL (ref 0.0–100.0)

## 2020-11-18 NOTE — ED Triage Notes (Signed)
Pt arrived via [facility] from home for shortness of breath. Pt wears 4LNC at night, acute onset shortness of breath and chest pain began at approx 930 tonight not relieved by at home oxygen. EMS report pt was 84% on 4LNC with extremely long tubing. EMS placed pt on 4LNC by EMS and sats increased to 94%. EMS attempted to administer duo neb and pt was non-compliant and received unknown amount of blow by.

## 2020-11-19 ENCOUNTER — Emergency Department (HOSPITAL_COMMUNITY): Payer: Medicare HMO

## 2020-11-19 DIAGNOSIS — M5134 Other intervertebral disc degeneration, thoracic region: Secondary | ICD-10-CM | POA: Diagnosis not present

## 2020-11-19 DIAGNOSIS — I700 Atherosclerosis of aorta: Secondary | ICD-10-CM | POA: Diagnosis not present

## 2020-11-19 DIAGNOSIS — J984 Other disorders of lung: Secondary | ICD-10-CM | POA: Diagnosis not present

## 2020-11-19 DIAGNOSIS — I2510 Atherosclerotic heart disease of native coronary artery without angina pectoris: Secondary | ICD-10-CM | POA: Diagnosis not present

## 2020-11-19 LAB — RESP PANEL BY RT-PCR (FLU A&B, COVID) ARPGX2
Influenza A by PCR: NEGATIVE
Influenza B by PCR: NEGATIVE
SARS Coronavirus 2 by RT PCR: NEGATIVE

## 2020-11-19 LAB — TSH: TSH: 1.69 u[IU]/mL (ref 0.350–4.500)

## 2020-11-19 LAB — TROPONIN I (HIGH SENSITIVITY): Troponin I (High Sensitivity): 9 ng/L (ref <18)

## 2020-11-19 MED ORDER — IPRATROPIUM-ALBUTEROL 0.5-2.5 (3) MG/3ML IN SOLN
3.0000 mL | Freq: Once | RESPIRATORY_TRACT | Status: AC
  Administered 2020-11-19: 3 mL via RESPIRATORY_TRACT
  Filled 2020-11-19: qty 3

## 2020-11-19 MED ORDER — IOHEXOL 350 MG/ML SOLN
75.0000 mL | Freq: Once | INTRAVENOUS | Status: AC | PRN
  Administered 2020-11-19: 75 mL via INTRAVENOUS

## 2020-11-19 MED ORDER — AEROCHAMBER PLUS FLO-VU LARGE MISC
1.0000 | Freq: Once | Status: AC
  Administered 2020-11-19: 1

## 2020-11-19 MED ORDER — ALBUTEROL SULFATE HFA 108 (90 BASE) MCG/ACT IN AERS
2.0000 | INHALATION_SPRAY | Freq: Once | RESPIRATORY_TRACT | Status: AC
  Administered 2020-11-19: 2 via RESPIRATORY_TRACT
  Filled 2020-11-19: qty 6.7

## 2020-11-19 MED ORDER — PREDNISONE 10 MG (21) PO TBPK: ORAL_TABLET | Freq: Every day | ORAL | 0 refills | Status: DC

## 2020-11-19 MED ORDER — PREDNISONE 20 MG PO TABS
60.0000 mg | ORAL_TABLET | Freq: Once | ORAL | Status: AC
  Administered 2020-11-19: 60 mg via ORAL
  Filled 2020-11-19: qty 3
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
## 2020-11-19 NOTE — Discharge Instructions (Addendum)
Thank you for allowing me to care for you today in the Emergency Department.   You were seen today for shortness of breath.  Your symptoms were consistent with a COPD exacerbation.  We did also send a COVID-19 test and the results are pending.  You can use 2 puffs of the albuterol inhaler every 4-6 hours as needed for shortness of breath.  Use it with a spacer to make the medication more effective.  Make sure that you continue to use your Trelegy Ellipta medication at home for your symptoms.  Take prednisone as prescribed starting tomorrow.  Your first dose was given this morning in the emergency department.  Call to schedule follow-up appoint with your primary care provider.  Please try to be seen in the next 2 to 3 days for reevaluation.  Return to the emergency department if you pass out, if you develop respiratory distress, if your fingers or lips turn blue, you develop severe chest pain, or other new, concerning symptoms.

## 2020-11-19 NOTE — ED Notes (Addendum)
Spoke with pt husband at [phone]. He is aware patient is ready to be picked up. Pt has d/c paperwork and ready to leave,

## 2020-11-20 DIAGNOSIS — Z9981 Dependence on supplemental oxygen: Secondary | ICD-10-CM | POA: Diagnosis not present

## 2020-11-20 DIAGNOSIS — I5082 Biventricular heart failure: Secondary | ICD-10-CM | POA: Diagnosis not present

## 2020-11-20 DIAGNOSIS — R69 Illness, unspecified: Secondary | ICD-10-CM | POA: Diagnosis not present

## 2020-11-20 DIAGNOSIS — I5033 Acute on chronic diastolic (congestive) heart failure: Secondary | ICD-10-CM | POA: Diagnosis not present

## 2020-11-20 DIAGNOSIS — K5900 Constipation, unspecified: Secondary | ICD-10-CM | POA: Diagnosis not present

## 2020-11-20 DIAGNOSIS — E039 Hypothyroidism, unspecified: Secondary | ICD-10-CM | POA: Diagnosis not present

## 2020-11-20 DIAGNOSIS — I272 Pulmonary hypertension, unspecified: Secondary | ICD-10-CM | POA: Diagnosis not present

## 2020-11-20 DIAGNOSIS — I110 Hypertensive heart disease with heart failure: Secondary | ICD-10-CM | POA: Diagnosis not present

## 2020-11-20 DIAGNOSIS — E785 Hyperlipidemia, unspecified: Secondary | ICD-10-CM | POA: Diagnosis not present

## 2020-11-20 DIAGNOSIS — L538 Other specified erythematous conditions: Secondary | ICD-10-CM | POA: Diagnosis not present

## 2020-11-20 DIAGNOSIS — E049 Nontoxic goiter, unspecified: Secondary | ICD-10-CM | POA: Diagnosis not present

## 2020-11-20 DIAGNOSIS — F32A Depression, unspecified: Secondary | ICD-10-CM | POA: Diagnosis not present

## 2020-11-20 DIAGNOSIS — J441 Chronic obstructive pulmonary disease with (acute) exacerbation: Secondary | ICD-10-CM | POA: Diagnosis not present

## 2020-11-20 DIAGNOSIS — Z87891 Personal history of nicotine dependence: Secondary | ICD-10-CM | POA: Diagnosis not present

## 2020-11-20 DIAGNOSIS — Z7951 Long term (current) use of inhaled steroids: Secondary | ICD-10-CM | POA: Diagnosis not present

## 2020-11-20 DIAGNOSIS — M1990 Unspecified osteoarthritis, unspecified site: Secondary | ICD-10-CM | POA: Diagnosis not present

## 2020-11-20 DIAGNOSIS — M810 Age-related osteoporosis without current pathological fracture: Secondary | ICD-10-CM | POA: Diagnosis not present

## 2020-11-24 DIAGNOSIS — M1990 Unspecified osteoarthritis, unspecified site: Secondary | ICD-10-CM | POA: Diagnosis not present

## 2020-11-24 DIAGNOSIS — Z9981 Dependence on supplemental oxygen: Secondary | ICD-10-CM | POA: Diagnosis not present

## 2020-11-24 DIAGNOSIS — I5082 Biventricular heart failure: Secondary | ICD-10-CM | POA: Diagnosis not present

## 2020-11-24 DIAGNOSIS — Z7951 Long term (current) use of inhaled steroids: Secondary | ICD-10-CM | POA: Diagnosis not present

## 2020-11-24 DIAGNOSIS — Z87891 Personal history of nicotine dependence: Secondary | ICD-10-CM | POA: Diagnosis not present

## 2020-11-24 DIAGNOSIS — L538 Other specified erythematous conditions: Secondary | ICD-10-CM | POA: Diagnosis not present

## 2020-11-24 DIAGNOSIS — M810 Age-related osteoporosis without current pathological fracture: Secondary | ICD-10-CM | POA: Diagnosis not present

## 2020-11-24 DIAGNOSIS — E049 Nontoxic goiter, unspecified: Secondary | ICD-10-CM | POA: Diagnosis not present

## 2020-11-24 DIAGNOSIS — F32A Depression, unspecified: Secondary | ICD-10-CM | POA: Diagnosis not present

## 2020-11-24 DIAGNOSIS — I110 Hypertensive heart disease with heart failure: Secondary | ICD-10-CM | POA: Diagnosis not present

## 2020-11-24 DIAGNOSIS — I5033 Acute on chronic diastolic (congestive) heart failure: Secondary | ICD-10-CM | POA: Diagnosis not present

## 2020-11-24 DIAGNOSIS — J441 Chronic obstructive pulmonary disease with (acute) exacerbation: Secondary | ICD-10-CM | POA: Diagnosis not present

## 2020-11-24 DIAGNOSIS — I272 Pulmonary hypertension, unspecified: Secondary | ICD-10-CM | POA: Diagnosis not present

## 2020-11-24 DIAGNOSIS — K5900 Constipation, unspecified: Secondary | ICD-10-CM | POA: Diagnosis not present

## 2020-11-24 DIAGNOSIS — E785 Hyperlipidemia, unspecified: Secondary | ICD-10-CM | POA: Diagnosis not present

## 2020-11-24 DIAGNOSIS — R69 Illness, unspecified: Secondary | ICD-10-CM | POA: Diagnosis not present

## 2020-11-24 DIAGNOSIS — E039 Hypothyroidism, unspecified: Secondary | ICD-10-CM | POA: Diagnosis not present

## 2020-12-03 DIAGNOSIS — E049 Nontoxic goiter, unspecified: Secondary | ICD-10-CM | POA: Diagnosis not present

## 2020-12-03 DIAGNOSIS — E785 Hyperlipidemia, unspecified: Secondary | ICD-10-CM | POA: Diagnosis not present

## 2020-12-03 DIAGNOSIS — F32A Depression, unspecified: Secondary | ICD-10-CM | POA: Diagnosis not present

## 2020-12-03 DIAGNOSIS — I5033 Acute on chronic diastolic (congestive) heart failure: Secondary | ICD-10-CM | POA: Diagnosis not present

## 2020-12-03 DIAGNOSIS — J441 Chronic obstructive pulmonary disease with (acute) exacerbation: Secondary | ICD-10-CM | POA: Diagnosis not present

## 2020-12-03 DIAGNOSIS — Z9981 Dependence on supplemental oxygen: Secondary | ICD-10-CM | POA: Diagnosis not present

## 2020-12-03 DIAGNOSIS — Z7951 Long term (current) use of inhaled steroids: Secondary | ICD-10-CM | POA: Diagnosis not present

## 2020-12-03 DIAGNOSIS — I272 Pulmonary hypertension, unspecified: Secondary | ICD-10-CM | POA: Diagnosis not present

## 2020-12-03 DIAGNOSIS — M810 Age-related osteoporosis without current pathological fracture: Secondary | ICD-10-CM | POA: Diagnosis not present

## 2020-12-03 DIAGNOSIS — L538 Other specified erythematous conditions: Secondary | ICD-10-CM | POA: Diagnosis not present

## 2020-12-03 DIAGNOSIS — K5900 Constipation, unspecified: Secondary | ICD-10-CM | POA: Diagnosis not present

## 2020-12-03 DIAGNOSIS — I110 Hypertensive heart disease with heart failure: Secondary | ICD-10-CM | POA: Diagnosis not present

## 2020-12-03 DIAGNOSIS — E039 Hypothyroidism, unspecified: Secondary | ICD-10-CM | POA: Diagnosis not present

## 2020-12-03 DIAGNOSIS — M1990 Unspecified osteoarthritis, unspecified site: Secondary | ICD-10-CM | POA: Diagnosis not present

## 2020-12-03 DIAGNOSIS — I5082 Biventricular heart failure: Secondary | ICD-10-CM | POA: Diagnosis not present

## 2020-12-03 DIAGNOSIS — R69 Illness, unspecified: Secondary | ICD-10-CM | POA: Diagnosis not present

## 2020-12-03 DIAGNOSIS — Z87891 Personal history of nicotine dependence: Secondary | ICD-10-CM | POA: Diagnosis not present

## 2020-12-14 DIAGNOSIS — I272 Pulmonary hypertension, unspecified: Secondary | ICD-10-CM | POA: Diagnosis not present

## 2020-12-14 DIAGNOSIS — J441 Chronic obstructive pulmonary disease with (acute) exacerbation: Secondary | ICD-10-CM | POA: Diagnosis not present

## 2020-12-17 DIAGNOSIS — E785 Hyperlipidemia, unspecified: Secondary | ICD-10-CM | POA: Diagnosis not present

## 2020-12-17 DIAGNOSIS — I272 Pulmonary hypertension, unspecified: Secondary | ICD-10-CM | POA: Diagnosis not present

## 2020-12-17 DIAGNOSIS — F32A Depression, unspecified: Secondary | ICD-10-CM | POA: Diagnosis not present

## 2020-12-17 DIAGNOSIS — Z7951 Long term (current) use of inhaled steroids: Secondary | ICD-10-CM | POA: Diagnosis not present

## 2020-12-17 DIAGNOSIS — M1990 Unspecified osteoarthritis, unspecified site: Secondary | ICD-10-CM | POA: Diagnosis not present

## 2020-12-17 DIAGNOSIS — Z87891 Personal history of nicotine dependence: Secondary | ICD-10-CM | POA: Diagnosis not present

## 2020-12-17 DIAGNOSIS — I5033 Acute on chronic diastolic (congestive) heart failure: Secondary | ICD-10-CM | POA: Diagnosis not present

## 2020-12-17 DIAGNOSIS — M810 Age-related osteoporosis without current pathological fracture: Secondary | ICD-10-CM | POA: Diagnosis not present

## 2020-12-17 DIAGNOSIS — I110 Hypertensive heart disease with heart failure: Secondary | ICD-10-CM | POA: Diagnosis not present

## 2020-12-17 DIAGNOSIS — R69 Illness, unspecified: Secondary | ICD-10-CM | POA: Diagnosis not present

## 2020-12-17 DIAGNOSIS — E049 Nontoxic goiter, unspecified: Secondary | ICD-10-CM | POA: Diagnosis not present

## 2020-12-17 DIAGNOSIS — I5082 Biventricular heart failure: Secondary | ICD-10-CM | POA: Diagnosis not present

## 2020-12-17 DIAGNOSIS — J441 Chronic obstructive pulmonary disease with (acute) exacerbation: Secondary | ICD-10-CM | POA: Diagnosis not present

## 2020-12-17 DIAGNOSIS — Z9981 Dependence on supplemental oxygen: Secondary | ICD-10-CM | POA: Diagnosis not present

## 2020-12-17 DIAGNOSIS — L538 Other specified erythematous conditions: Secondary | ICD-10-CM | POA: Diagnosis not present

## 2020-12-17 DIAGNOSIS — E039 Hypothyroidism, unspecified: Secondary | ICD-10-CM | POA: Diagnosis not present

## 2020-12-17 DIAGNOSIS — K5900 Constipation, unspecified: Secondary | ICD-10-CM | POA: Diagnosis not present

## 2020-12-20 ENCOUNTER — Encounter: Payer: Self-pay | Admitting: Gastroenterology

## 2021-01-08 ENCOUNTER — Ambulatory Visit: Payer: Medicare HMO | Admitting: Pulmonary Disease

## 2021-01-08 ENCOUNTER — Other Ambulatory Visit: Payer: Self-pay

## 2021-01-08 ENCOUNTER — Encounter: Payer: Self-pay | Admitting: Pulmonary Disease

## 2021-01-08 VITALS — BP 116/60 | HR 70 | Ht 62.0 in | Wt 152.6 lb

## 2021-01-08 DIAGNOSIS — I272 Pulmonary hypertension, unspecified: Secondary | ICD-10-CM

## 2021-01-08 DIAGNOSIS — R0602 Shortness of breath: Secondary | ICD-10-CM | POA: Diagnosis not present

## 2021-01-08 DIAGNOSIS — J9611 Chronic respiratory failure with hypoxia: Secondary | ICD-10-CM | POA: Diagnosis not present
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
## 2021-01-08 NOTE — Patient Instructions (Addendum)
We will schedule you for pulmonary function tests at your 42-monthfollow-up visit.  Continue on Trelegy Ellipta inhaler 1 puff daily -Rinse mouth out after each use  Use albuterol inhaler 1 to 2 puffs every 4-6 hours as needed for cough, shortness of breath, wheezing or chest tightness  We will schedule you for nocturnal oximetry testing on room air (do not wear your oxygen) to determine if you need oxygen when you sleep.

## 2021-01-14 ENCOUNTER — Encounter: Payer: Self-pay | Admitting: Pulmonary Disease

## 2021-01-14 DIAGNOSIS — I272 Pulmonary hypertension, unspecified: Secondary | ICD-10-CM | POA: Diagnosis not present

## 2021-01-14 DIAGNOSIS — J441 Chronic obstructive pulmonary disease with (acute) exacerbation: Secondary | ICD-10-CM | POA: Diagnosis not present

## 2021-01-14 DIAGNOSIS — R0902 Hypoxemia: Secondary | ICD-10-CM | POA: Diagnosis not present

## 2021-01-21 ENCOUNTER — Telehealth: Payer: Self-pay | Admitting: Pulmonary Disease

## 2021-01-21 NOTE — Telephone Encounter (Signed)
JD please advise.   Calling to get the results of her ONO.  thanks

## 2021-01-22 NOTE — Telephone Encounter (Signed)
I have not received the ONO results. Please contact the DME company who performed the study to send the results to our office.  Wille Glaser

## 2021-01-22 NOTE — Telephone Encounter (Signed)
I have messaged Danielle at [employer] to get the ONO faxed to the office, will await response.

## 2021-01-22 NOTE — Telephone Encounter (Signed)
Did we ever receive any word back or check up front for this?

## 2021-01-23 NOTE — Telephone Encounter (Signed)
Pt is asking for ONO results & can be reached at [phone]

## 2021-01-26 NOTE — Telephone Encounter (Signed)
Nothing has been faxed over just yet. [name] is not in office to see if [name] has messaged her back. I have sent a message to [name] to fax to my tax machine in [place].  I have also routed this to triage to keep an eye out for it.

## 2021-02-03 ENCOUNTER — Ambulatory Visit: Payer: Medicare HMO | Admitting: Internal Medicine

## 2021-02-03 DIAGNOSIS — N390 Urinary tract infection, site not specified: Secondary | ICD-10-CM | POA: Diagnosis not present

## 2021-02-03 DIAGNOSIS — I5030 Unspecified diastolic (congestive) heart failure: Secondary | ICD-10-CM | POA: Diagnosis not present

## 2021-02-03 DIAGNOSIS — I700 Atherosclerosis of aorta: Secondary | ICD-10-CM | POA: Diagnosis not present

## 2021-02-03 DIAGNOSIS — E782 Mixed hyperlipidemia: Secondary | ICD-10-CM | POA: Diagnosis not present

## 2021-02-03 DIAGNOSIS — I10 Essential (primary) hypertension: Secondary | ICD-10-CM | POA: Diagnosis not present

## 2021-02-03 DIAGNOSIS — Z6830 Body mass index (BMI) 30.0-30.9, adult: Secondary | ICD-10-CM | POA: Diagnosis not present

## 2021-02-03 DIAGNOSIS — R7303 Prediabetes: Secondary | ICD-10-CM | POA: Diagnosis not present

## 2021-02-12 NOTE — Telephone Encounter (Signed)
Will forward back to triage to look for this ONO. Will route this as urgent as this is time sensitive.

## 2021-02-12 NOTE — Telephone Encounter (Signed)
I checked with Cherina, Dr. Erin Fulling nurse, to see if the ONO has been received. She stated it is in his look-at folder and he has not looked at it yet but she will let him now. Will keep in triage for f/u.

## 2021-02-14 DIAGNOSIS — I272 Pulmonary hypertension, unspecified: Secondary | ICD-10-CM | POA: Diagnosis not present

## 2021-02-14 DIAGNOSIS — J441 Chronic obstructive pulmonary disease with (acute) exacerbation: Secondary | ICD-10-CM | POA: Diagnosis not present

## 2021-02-19 NOTE — Telephone Encounter (Signed)
Patient's ONO shows an SpO2 less than 88% for 8hr and 32min. She has a SpO2 low of 64%. I would recommend that she continue her nocturnal oxygen and that if she is ok with sending her for a split night sleep study in lab to further evaluate her breathing at night.   Thanks, Wille Glaser

## 2021-02-19 NOTE — Telephone Encounter (Signed)
Called and spoke with patient's husband. He verbalized understanding of results and will pass the results to Mrs. Nale.   Nothing further needed at time of call.

## 2021-03-04 DIAGNOSIS — N390 Urinary tract infection, site not specified: Secondary | ICD-10-CM | POA: Diagnosis not present

## 2021-03-04 DIAGNOSIS — Z79899 Other long term (current) drug therapy: Secondary | ICD-10-CM | POA: Diagnosis not present

## 2021-03-04 DIAGNOSIS — R310 Gross hematuria: Secondary | ICD-10-CM | POA: Diagnosis not present

## 2021-03-04 DIAGNOSIS — Z6830 Body mass index (BMI) 30.0-30.9, adult: Secondary | ICD-10-CM | POA: Diagnosis not present

## 2021-03-16 DIAGNOSIS — I272 Pulmonary hypertension, unspecified: Secondary | ICD-10-CM | POA: Diagnosis not present

## 2021-03-16 DIAGNOSIS — J441 Chronic obstructive pulmonary disease with (acute) exacerbation: Secondary | ICD-10-CM | POA: Diagnosis not present

## 2021-04-06 DIAGNOSIS — N200 Calculus of kidney: Secondary | ICD-10-CM | POA: Diagnosis not present

## 2021-04-06 DIAGNOSIS — I2510 Atherosclerotic heart disease of native coronary artery without angina pectoris: Secondary | ICD-10-CM | POA: Diagnosis not present

## 2021-04-06 DIAGNOSIS — N3289 Other specified disorders of bladder: Secondary | ICD-10-CM | POA: Diagnosis not present

## 2021-04-06 DIAGNOSIS — K8020 Calculus of gallbladder without cholecystitis without obstruction: Secondary | ICD-10-CM | POA: Diagnosis not present

## 2021-04-06 DIAGNOSIS — R310 Gross hematuria: Secondary | ICD-10-CM | POA: Diagnosis not present

## 2021-04-06 DIAGNOSIS — I700 Atherosclerosis of aorta: Secondary | ICD-10-CM | POA: Diagnosis not present

## 2021-04-06 DIAGNOSIS — K760 Fatty (change of) liver, not elsewhere classified: Secondary | ICD-10-CM | POA: Diagnosis not present

## 2021-04-06 DIAGNOSIS — R9341 Abnormal radiologic findings on diagnostic imaging of renal pelvis, ureter, or bladder: Secondary | ICD-10-CM | POA: Diagnosis not present

## 2021-04-16 DIAGNOSIS — J441 Chronic obstructive pulmonary disease with (acute) exacerbation: Secondary | ICD-10-CM | POA: Diagnosis not present

## 2021-04-16 DIAGNOSIS — I272 Pulmonary hypertension, unspecified: Secondary | ICD-10-CM | POA: Diagnosis not present

## 2021-04-20 DIAGNOSIS — N3289 Other specified disorders of bladder: Secondary | ICD-10-CM | POA: Diagnosis not present

## 2021-04-20 DIAGNOSIS — R310 Gross hematuria: Secondary | ICD-10-CM | POA: Diagnosis not present

## 2021-04-22 DIAGNOSIS — S81801A Unspecified open wound, right lower leg, initial encounter: Secondary | ICD-10-CM | POA: Diagnosis not present

## 2021-05-06 DIAGNOSIS — R0902 Hypoxemia: Secondary | ICD-10-CM | POA: Diagnosis not present

## 2021-05-06 DIAGNOSIS — E871 Hypo-osmolality and hyponatremia: Secondary | ICD-10-CM | POA: Diagnosis not present

## 2021-05-06 DIAGNOSIS — R58 Hemorrhage, not elsewhere classified: Secondary | ICD-10-CM | POA: Diagnosis not present

## 2021-05-06 DIAGNOSIS — L03115 Cellulitis of right lower limb: Secondary | ICD-10-CM | POA: Diagnosis not present

## 2021-05-06 DIAGNOSIS — S61412A Laceration without foreign body of left hand, initial encounter: Secondary | ICD-10-CM | POA: Diagnosis not present

## 2021-05-06 DIAGNOSIS — G928 Other toxic encephalopathy: Secondary | ICD-10-CM | POA: Diagnosis not present

## 2021-05-06 DIAGNOSIS — Z743 Need for continuous supervision: Secondary | ICD-10-CM | POA: Diagnosis not present

## 2021-05-07 DIAGNOSIS — R000 Tachycardia, unspecified: Secondary | ICD-10-CM | POA: Diagnosis not present

## 2021-05-07 DIAGNOSIS — Z9981 Dependence on supplemental oxygen: Secondary | ICD-10-CM | POA: Diagnosis not present

## 2021-05-07 DIAGNOSIS — J9621 Acute and chronic respiratory failure with hypoxia: Secondary | ICD-10-CM | POA: Diagnosis not present

## 2021-05-07 DIAGNOSIS — I517 Cardiomegaly: Secondary | ICD-10-CM | POA: Diagnosis not present

## 2021-05-07 DIAGNOSIS — Z043 Encounter for examination and observation following other accident: Secondary | ICD-10-CM | POA: Diagnosis not present

## 2021-05-07 DIAGNOSIS — R059 Cough, unspecified: Secondary | ICD-10-CM | POA: Diagnosis not present

## 2021-05-07 DIAGNOSIS — L03114 Cellulitis of left upper limb: Secondary | ICD-10-CM | POA: Diagnosis not present

## 2021-05-07 DIAGNOSIS — J441 Chronic obstructive pulmonary disease with (acute) exacerbation: Secondary | ICD-10-CM | POA: Diagnosis not present

## 2021-05-07 DIAGNOSIS — J984 Other disorders of lung: Secondary | ICD-10-CM | POA: Diagnosis not present

## 2021-05-07 DIAGNOSIS — L03115 Cellulitis of right lower limb: Secondary | ICD-10-CM | POA: Diagnosis not present

## 2021-05-07 DIAGNOSIS — R0902 Hypoxemia: Secondary | ICD-10-CM | POA: Diagnosis not present

## 2021-05-07 DIAGNOSIS — G928 Other toxic encephalopathy: Secondary | ICD-10-CM | POA: Diagnosis not present

## 2021-05-07 DIAGNOSIS — I491 Atrial premature depolarization: Secondary | ICD-10-CM | POA: Diagnosis not present

## 2021-05-07 DIAGNOSIS — J9620 Acute and chronic respiratory failure, unspecified whether with hypoxia or hypercapnia: Secondary | ICD-10-CM | POA: Diagnosis not present

## 2021-05-07 DIAGNOSIS — S61412A Laceration without foreign body of left hand, initial encounter: Secondary | ICD-10-CM | POA: Diagnosis not present

## 2021-05-07 DIAGNOSIS — I272 Pulmonary hypertension, unspecified: Secondary | ICD-10-CM | POA: Diagnosis not present

## 2021-05-07 DIAGNOSIS — J9690 Respiratory failure, unspecified, unspecified whether with hypoxia or hypercapnia: Secondary | ICD-10-CM | POA: Diagnosis not present

## 2021-05-07 DIAGNOSIS — J449 Chronic obstructive pulmonary disease, unspecified: Secondary | ICD-10-CM | POA: Diagnosis not present

## 2021-05-07 DIAGNOSIS — W19XXXA Unspecified fall, initial encounter: Secondary | ICD-10-CM | POA: Diagnosis not present

## 2021-05-07 DIAGNOSIS — S0990XA Unspecified injury of head, initial encounter: Secondary | ICD-10-CM | POA: Diagnosis not present

## 2021-05-07 DIAGNOSIS — N3001 Acute cystitis with hematuria: Secondary | ICD-10-CM | POA: Diagnosis not present

## 2021-05-07 DIAGNOSIS — R0602 Shortness of breath: Secondary | ICD-10-CM | POA: Diagnosis not present

## 2021-05-07 DIAGNOSIS — J9811 Atelectasis: Secondary | ICD-10-CM | POA: Diagnosis not present

## 2021-05-07 DIAGNOSIS — J811 Chronic pulmonary edema: Secondary | ICD-10-CM | POA: Diagnosis not present

## 2021-05-07 DIAGNOSIS — E871 Hypo-osmolality and hyponatremia: Secondary | ICD-10-CM | POA: Diagnosis not present

## 2021-05-07 DIAGNOSIS — N390 Urinary tract infection, site not specified: Secondary | ICD-10-CM | POA: Diagnosis not present

## 2021-05-09 DIAGNOSIS — R0602 Shortness of breath: Secondary | ICD-10-CM | POA: Diagnosis not present

## 2021-05-09 DIAGNOSIS — R000 Tachycardia, unspecified: Secondary | ICD-10-CM

## 2021-05-10 DIAGNOSIS — J9811 Atelectasis: Secondary | ICD-10-CM | POA: Diagnosis not present

## 2021-05-10 DIAGNOSIS — I517 Cardiomegaly: Secondary | ICD-10-CM | POA: Diagnosis not present

## 2021-05-10 DIAGNOSIS — R0902 Hypoxemia: Secondary | ICD-10-CM | POA: Diagnosis not present

## 2021-05-11 DIAGNOSIS — Z9981 Dependence on supplemental oxygen: Secondary | ICD-10-CM | POA: Diagnosis not present

## 2021-05-11 DIAGNOSIS — J449 Chronic obstructive pulmonary disease, unspecified: Secondary | ICD-10-CM | POA: Diagnosis not present

## 2021-05-11 DIAGNOSIS — J9620 Acute and chronic respiratory failure, unspecified whether with hypoxia or hypercapnia: Secondary | ICD-10-CM | POA: Diagnosis not present

## 2021-05-11 DIAGNOSIS — I491 Atrial premature depolarization: Secondary | ICD-10-CM

## 2021-05-11 DIAGNOSIS — J9690 Respiratory failure, unspecified, unspecified whether with hypoxia or hypercapnia: Secondary | ICD-10-CM

## 2021-05-12 DIAGNOSIS — I517 Cardiomegaly: Secondary | ICD-10-CM | POA: Diagnosis not present

## 2021-05-12 DIAGNOSIS — R059 Cough, unspecified: Secondary | ICD-10-CM | POA: Diagnosis not present

## 2021-05-12 DIAGNOSIS — R000 Tachycardia, unspecified: Secondary | ICD-10-CM

## 2021-05-12 DIAGNOSIS — I491 Atrial premature depolarization: Secondary | ICD-10-CM | POA: Diagnosis not present

## 2021-05-12 DIAGNOSIS — J9690 Respiratory failure, unspecified, unspecified whether with hypoxia or hypercapnia: Secondary | ICD-10-CM | POA: Diagnosis not present

## 2021-05-13 DIAGNOSIS — J9621 Acute and chronic respiratory failure with hypoxia: Secondary | ICD-10-CM | POA: Diagnosis not present

## 2021-05-13 DIAGNOSIS — J449 Chronic obstructive pulmonary disease, unspecified: Secondary | ICD-10-CM | POA: Diagnosis not present

## 2021-05-13 DIAGNOSIS — N3001 Acute cystitis with hematuria: Secondary | ICD-10-CM | POA: Diagnosis not present

## 2021-05-13 DIAGNOSIS — G928 Other toxic encephalopathy: Secondary | ICD-10-CM | POA: Diagnosis not present

## 2021-05-13 DIAGNOSIS — J9620 Acute and chronic respiratory failure, unspecified whether with hypoxia or hypercapnia: Secondary | ICD-10-CM | POA: Diagnosis not present

## 2021-05-13 DIAGNOSIS — Z9981 Dependence on supplemental oxygen: Secondary | ICD-10-CM | POA: Diagnosis not present

## 2021-05-14 DIAGNOSIS — J9620 Acute and chronic respiratory failure, unspecified whether with hypoxia or hypercapnia: Secondary | ICD-10-CM | POA: Diagnosis not present

## 2021-05-14 DIAGNOSIS — N3001 Acute cystitis with hematuria: Secondary | ICD-10-CM | POA: Diagnosis not present

## 2021-05-14 DIAGNOSIS — J449 Chronic obstructive pulmonary disease, unspecified: Secondary | ICD-10-CM | POA: Diagnosis not present

## 2021-05-14 DIAGNOSIS — Z9981 Dependence on supplemental oxygen: Secondary | ICD-10-CM | POA: Diagnosis not present

## 2021-05-14 DIAGNOSIS — G928 Other toxic encephalopathy: Secondary | ICD-10-CM | POA: Diagnosis not present

## 2021-05-14 DIAGNOSIS — J9621 Acute and chronic respiratory failure with hypoxia: Secondary | ICD-10-CM | POA: Diagnosis not present

## 2021-05-15 DIAGNOSIS — G928 Other toxic encephalopathy: Secondary | ICD-10-CM | POA: Diagnosis not present

## 2021-05-15 DIAGNOSIS — N3001 Acute cystitis with hematuria: Secondary | ICD-10-CM | POA: Diagnosis not present

## 2021-05-15 DIAGNOSIS — J9620 Acute and chronic respiratory failure, unspecified whether with hypoxia or hypercapnia: Secondary | ICD-10-CM | POA: Diagnosis not present

## 2021-05-15 DIAGNOSIS — J449 Chronic obstructive pulmonary disease, unspecified: Secondary | ICD-10-CM | POA: Diagnosis not present

## 2021-05-15 DIAGNOSIS — Z9981 Dependence on supplemental oxygen: Secondary | ICD-10-CM | POA: Diagnosis not present

## 2021-05-15 DIAGNOSIS — J9621 Acute and chronic respiratory failure with hypoxia: Secondary | ICD-10-CM | POA: Diagnosis not present

## 2021-05-16 DIAGNOSIS — J9621 Acute and chronic respiratory failure with hypoxia: Secondary | ICD-10-CM | POA: Diagnosis not present

## 2021-05-16 DIAGNOSIS — N3001 Acute cystitis with hematuria: Secondary | ICD-10-CM | POA: Diagnosis not present

## 2021-05-16 DIAGNOSIS — I272 Pulmonary hypertension, unspecified: Secondary | ICD-10-CM | POA: Diagnosis not present

## 2021-05-16 DIAGNOSIS — G928 Other toxic encephalopathy: Secondary | ICD-10-CM | POA: Diagnosis not present

## 2021-05-16 DIAGNOSIS — J441 Chronic obstructive pulmonary disease with (acute) exacerbation: Secondary | ICD-10-CM | POA: Diagnosis not present

## 2021-05-17 DIAGNOSIS — N3001 Acute cystitis with hematuria: Secondary | ICD-10-CM | POA: Diagnosis not present

## 2021-05-17 DIAGNOSIS — G928 Other toxic encephalopathy: Secondary | ICD-10-CM | POA: Diagnosis not present

## 2021-05-17 DIAGNOSIS — J9621 Acute and chronic respiratory failure with hypoxia: Secondary | ICD-10-CM | POA: Diagnosis not present

## 2021-05-18 DIAGNOSIS — J449 Chronic obstructive pulmonary disease, unspecified: Secondary | ICD-10-CM | POA: Diagnosis not present

## 2021-05-18 DIAGNOSIS — J9620 Acute and chronic respiratory failure, unspecified whether with hypoxia or hypercapnia: Secondary | ICD-10-CM | POA: Diagnosis not present

## 2021-05-18 DIAGNOSIS — Z9981 Dependence on supplemental oxygen: Secondary | ICD-10-CM | POA: Diagnosis not present

## 2021-05-18 DIAGNOSIS — N3001 Acute cystitis with hematuria: Secondary | ICD-10-CM | POA: Diagnosis not present

## 2021-05-18 DIAGNOSIS — J9621 Acute and chronic respiratory failure with hypoxia: Secondary | ICD-10-CM | POA: Diagnosis not present

## 2021-05-18 DIAGNOSIS — G928 Other toxic encephalopathy: Secondary | ICD-10-CM | POA: Diagnosis not present

## 2021-05-19 DIAGNOSIS — J9621 Acute and chronic respiratory failure with hypoxia: Secondary | ICD-10-CM | POA: Diagnosis not present

## 2021-05-19 DIAGNOSIS — Z9981 Dependence on supplemental oxygen: Secondary | ICD-10-CM | POA: Diagnosis not present

## 2021-05-19 DIAGNOSIS — G928 Other toxic encephalopathy: Secondary | ICD-10-CM | POA: Diagnosis not present

## 2021-05-19 DIAGNOSIS — J9620 Acute and chronic respiratory failure, unspecified whether with hypoxia or hypercapnia: Secondary | ICD-10-CM | POA: Diagnosis not present

## 2021-05-19 DIAGNOSIS — J449 Chronic obstructive pulmonary disease, unspecified: Secondary | ICD-10-CM | POA: Diagnosis not present

## 2021-05-19 DIAGNOSIS — N3001 Acute cystitis with hematuria: Secondary | ICD-10-CM | POA: Diagnosis not present

## 2021-05-20 DIAGNOSIS — J9620 Acute and chronic respiratory failure, unspecified whether with hypoxia or hypercapnia: Secondary | ICD-10-CM | POA: Diagnosis not present

## 2021-05-20 DIAGNOSIS — Z9981 Dependence on supplemental oxygen: Secondary | ICD-10-CM | POA: Diagnosis not present

## 2021-05-20 DIAGNOSIS — N3001 Acute cystitis with hematuria: Secondary | ICD-10-CM | POA: Diagnosis not present

## 2021-05-20 DIAGNOSIS — J9621 Acute and chronic respiratory failure with hypoxia: Secondary | ICD-10-CM | POA: Diagnosis not present

## 2021-05-20 DIAGNOSIS — G928 Other toxic encephalopathy: Secondary | ICD-10-CM | POA: Diagnosis not present

## 2021-05-20 DIAGNOSIS — J449 Chronic obstructive pulmonary disease, unspecified: Secondary | ICD-10-CM | POA: Diagnosis not present

## 2021-05-21 DIAGNOSIS — G928 Other toxic encephalopathy: Secondary | ICD-10-CM | POA: Diagnosis not present

## 2021-05-21 DIAGNOSIS — J9621 Acute and chronic respiratory failure with hypoxia: Secondary | ICD-10-CM | POA: Diagnosis not present

## 2021-05-21 DIAGNOSIS — N3001 Acute cystitis with hematuria: Secondary | ICD-10-CM | POA: Diagnosis not present

## 2021-05-22 DIAGNOSIS — Z743 Need for continuous supervision: Secondary | ICD-10-CM | POA: Diagnosis not present

## 2021-05-22 DIAGNOSIS — I959 Hypotension, unspecified: Secondary | ICD-10-CM | POA: Diagnosis not present

## 2021-05-22 DIAGNOSIS — R0902 Hypoxemia: Secondary | ICD-10-CM | POA: Diagnosis not present

## 2021-05-22 DIAGNOSIS — G928 Other toxic encephalopathy: Secondary | ICD-10-CM | POA: Diagnosis not present

## 2021-05-22 DIAGNOSIS — N3001 Acute cystitis with hematuria: Secondary | ICD-10-CM | POA: Diagnosis not present

## 2021-05-22 DIAGNOSIS — J9621 Acute and chronic respiratory failure with hypoxia: Secondary | ICD-10-CM | POA: Diagnosis not present

## 2021-05-27 DIAGNOSIS — E785 Hyperlipidemia, unspecified: Secondary | ICD-10-CM | POA: Diagnosis not present

## 2021-05-27 DIAGNOSIS — I10 Essential (primary) hypertension: Secondary | ICD-10-CM | POA: Diagnosis not present

## 2021-05-27 DIAGNOSIS — N3289 Other specified disorders of bladder: Secondary | ICD-10-CM | POA: Diagnosis not present

## 2021-05-27 DIAGNOSIS — R2689 Other abnormalities of gait and mobility: Secondary | ICD-10-CM | POA: Diagnosis not present

## 2021-05-27 DIAGNOSIS — Z9181 History of falling: Secondary | ICD-10-CM | POA: Diagnosis not present

## 2021-05-27 DIAGNOSIS — J449 Chronic obstructive pulmonary disease, unspecified: Secondary | ICD-10-CM | POA: Diagnosis not present

## 2021-05-27 DIAGNOSIS — N390 Urinary tract infection, site not specified: Secondary | ICD-10-CM | POA: Diagnosis not present

## 2021-05-27 DIAGNOSIS — K589 Irritable bowel syndrome without diarrhea: Secondary | ICD-10-CM | POA: Diagnosis not present

## 2021-05-27 DIAGNOSIS — J9620 Acute and chronic respiratory failure, unspecified whether with hypoxia or hypercapnia: Secondary | ICD-10-CM | POA: Diagnosis not present

## 2021-05-27 DIAGNOSIS — E871 Hypo-osmolality and hyponatremia: Secondary | ICD-10-CM | POA: Diagnosis not present

## 2021-05-27 DIAGNOSIS — R5381 Other malaise: Secondary | ICD-10-CM | POA: Diagnosis not present

## 2021-05-28 DIAGNOSIS — J449 Chronic obstructive pulmonary disease, unspecified: Secondary | ICD-10-CM | POA: Diagnosis not present

## 2021-05-28 DIAGNOSIS — I10 Essential (primary) hypertension: Secondary | ICD-10-CM | POA: Diagnosis not present

## 2021-05-28 DIAGNOSIS — Z0000 Encounter for general adult medical examination without abnormal findings: Secondary | ICD-10-CM | POA: Diagnosis not present

## 2021-05-28 DIAGNOSIS — R2689 Other abnormalities of gait and mobility: Secondary | ICD-10-CM | POA: Diagnosis not present

## 2021-05-28 DIAGNOSIS — Z743 Need for continuous supervision: Secondary | ICD-10-CM | POA: Diagnosis not present

## 2021-05-28 DIAGNOSIS — I700 Atherosclerosis of aorta: Secondary | ICD-10-CM | POA: Diagnosis not present

## 2021-05-28 DIAGNOSIS — R079 Chest pain, unspecified: Secondary | ICD-10-CM | POA: Diagnosis not present

## 2021-05-28 DIAGNOSIS — J9620 Acute and chronic respiratory failure, unspecified whether with hypoxia or hypercapnia: Secondary | ICD-10-CM | POA: Diagnosis not present

## 2021-05-28 DIAGNOSIS — R0902 Hypoxemia: Secondary | ICD-10-CM | POA: Diagnosis not present

## 2021-05-28 DIAGNOSIS — K589 Irritable bowel syndrome without diarrhea: Secondary | ICD-10-CM | POA: Diagnosis not present

## 2021-05-28 DIAGNOSIS — Z9181 History of falling: Secondary | ICD-10-CM | POA: Diagnosis not present

## 2021-05-28 DIAGNOSIS — R0602 Shortness of breath: Secondary | ICD-10-CM | POA: Diagnosis not present

## 2021-05-28 DIAGNOSIS — Z20822 Contact with and (suspected) exposure to covid-19: Secondary | ICD-10-CM | POA: Diagnosis not present

## 2021-05-28 DIAGNOSIS — E871 Hypo-osmolality and hyponatremia: Secondary | ICD-10-CM | POA: Diagnosis not present

## 2021-05-28 DIAGNOSIS — E785 Hyperlipidemia, unspecified: Secondary | ICD-10-CM | POA: Diagnosis not present

## 2021-05-29 DIAGNOSIS — J449 Chronic obstructive pulmonary disease, unspecified: Secondary | ICD-10-CM | POA: Diagnosis not present

## 2021-05-29 DIAGNOSIS — R2689 Other abnormalities of gait and mobility: Secondary | ICD-10-CM | POA: Diagnosis not present

## 2021-05-29 DIAGNOSIS — I10 Essential (primary) hypertension: Secondary | ICD-10-CM | POA: Diagnosis not present

## 2021-05-29 DIAGNOSIS — J811 Chronic pulmonary edema: Secondary | ICD-10-CM | POA: Diagnosis not present

## 2021-05-29 DIAGNOSIS — K589 Irritable bowel syndrome without diarrhea: Secondary | ICD-10-CM | POA: Diagnosis not present

## 2021-05-29 DIAGNOSIS — E871 Hypo-osmolality and hyponatremia: Secondary | ICD-10-CM | POA: Diagnosis not present

## 2021-05-29 DIAGNOSIS — J9620 Acute and chronic respiratory failure, unspecified whether with hypoxia or hypercapnia: Secondary | ICD-10-CM | POA: Diagnosis not present

## 2021-05-29 DIAGNOSIS — Z9181 History of falling: Secondary | ICD-10-CM | POA: Diagnosis not present

## 2021-05-29 DIAGNOSIS — E785 Hyperlipidemia, unspecified: Secondary | ICD-10-CM | POA: Diagnosis not present

## 2021-06-01 DIAGNOSIS — K589 Irritable bowel syndrome without diarrhea: Secondary | ICD-10-CM | POA: Diagnosis not present

## 2021-06-01 DIAGNOSIS — Z9181 History of falling: Secondary | ICD-10-CM | POA: Diagnosis not present

## 2021-06-01 DIAGNOSIS — J449 Chronic obstructive pulmonary disease, unspecified: Secondary | ICD-10-CM | POA: Diagnosis not present

## 2021-06-01 DIAGNOSIS — E785 Hyperlipidemia, unspecified: Secondary | ICD-10-CM | POA: Diagnosis not present

## 2021-06-01 DIAGNOSIS — I10 Essential (primary) hypertension: Secondary | ICD-10-CM | POA: Diagnosis not present

## 2021-06-01 DIAGNOSIS — J9620 Acute and chronic respiratory failure, unspecified whether with hypoxia or hypercapnia: Secondary | ICD-10-CM | POA: Diagnosis not present

## 2021-06-01 DIAGNOSIS — E871 Hypo-osmolality and hyponatremia: Secondary | ICD-10-CM | POA: Diagnosis not present

## 2021-06-01 DIAGNOSIS — R2689 Other abnormalities of gait and mobility: Secondary | ICD-10-CM | POA: Diagnosis not present

## 2021-06-02 DIAGNOSIS — R5381 Other malaise: Secondary | ICD-10-CM | POA: Diagnosis not present

## 2021-06-02 DIAGNOSIS — N390 Urinary tract infection, site not specified: Secondary | ICD-10-CM | POA: Diagnosis not present

## 2021-06-02 DIAGNOSIS — J9611 Chronic respiratory failure with hypoxia: Secondary | ICD-10-CM | POA: Diagnosis not present

## 2021-06-02 DIAGNOSIS — Z9181 History of falling: Secondary | ICD-10-CM | POA: Diagnosis not present

## 2021-06-02 DIAGNOSIS — E785 Hyperlipidemia, unspecified: Secondary | ICD-10-CM | POA: Diagnosis not present

## 2021-06-02 DIAGNOSIS — J449 Chronic obstructive pulmonary disease, unspecified: Secondary | ICD-10-CM | POA: Diagnosis not present

## 2021-06-02 DIAGNOSIS — K589 Irritable bowel syndrome without diarrhea: Secondary | ICD-10-CM | POA: Diagnosis not present

## 2021-06-02 DIAGNOSIS — R2689 Other abnormalities of gait and mobility: Secondary | ICD-10-CM | POA: Diagnosis not present

## 2021-06-02 DIAGNOSIS — J9620 Acute and chronic respiratory failure, unspecified whether with hypoxia or hypercapnia: Secondary | ICD-10-CM | POA: Diagnosis not present

## 2021-06-02 DIAGNOSIS — I10 Essential (primary) hypertension: Secondary | ICD-10-CM | POA: Diagnosis not present

## 2021-06-02 DIAGNOSIS — E871 Hypo-osmolality and hyponatremia: Secondary | ICD-10-CM | POA: Diagnosis not present

## 2021-06-03 DIAGNOSIS — E785 Hyperlipidemia, unspecified: Secondary | ICD-10-CM | POA: Diagnosis not present

## 2021-06-03 DIAGNOSIS — R2689 Other abnormalities of gait and mobility: Secondary | ICD-10-CM | POA: Diagnosis not present

## 2021-06-03 DIAGNOSIS — Z9181 History of falling: Secondary | ICD-10-CM | POA: Diagnosis not present

## 2021-06-03 DIAGNOSIS — K589 Irritable bowel syndrome without diarrhea: Secondary | ICD-10-CM | POA: Diagnosis not present

## 2021-06-03 DIAGNOSIS — E871 Hypo-osmolality and hyponatremia: Secondary | ICD-10-CM | POA: Diagnosis not present

## 2021-06-03 DIAGNOSIS — I10 Essential (primary) hypertension: Secondary | ICD-10-CM | POA: Diagnosis not present

## 2021-06-03 DIAGNOSIS — J449 Chronic obstructive pulmonary disease, unspecified: Secondary | ICD-10-CM | POA: Diagnosis not present

## 2021-06-03 DIAGNOSIS — J9620 Acute and chronic respiratory failure, unspecified whether with hypoxia or hypercapnia: Secondary | ICD-10-CM | POA: Diagnosis not present

## 2021-06-06 DIAGNOSIS — K589 Irritable bowel syndrome without diarrhea: Secondary | ICD-10-CM | POA: Diagnosis not present

## 2021-06-06 DIAGNOSIS — J449 Chronic obstructive pulmonary disease, unspecified: Secondary | ICD-10-CM | POA: Diagnosis not present

## 2021-06-06 DIAGNOSIS — R2689 Other abnormalities of gait and mobility: Secondary | ICD-10-CM | POA: Diagnosis not present

## 2021-06-06 DIAGNOSIS — I10 Essential (primary) hypertension: Secondary | ICD-10-CM | POA: Diagnosis not present

## 2021-06-06 DIAGNOSIS — Z9181 History of falling: Secondary | ICD-10-CM | POA: Diagnosis not present

## 2021-06-06 DIAGNOSIS — E785 Hyperlipidemia, unspecified: Secondary | ICD-10-CM | POA: Diagnosis not present

## 2021-06-06 DIAGNOSIS — E871 Hypo-osmolality and hyponatremia: Secondary | ICD-10-CM | POA: Diagnosis not present

## 2021-06-06 DIAGNOSIS — J9620 Acute and chronic respiratory failure, unspecified whether with hypoxia or hypercapnia: Secondary | ICD-10-CM | POA: Diagnosis not present

## 2021-06-07 DIAGNOSIS — J9620 Acute and chronic respiratory failure, unspecified whether with hypoxia or hypercapnia: Secondary | ICD-10-CM | POA: Diagnosis not present

## 2021-06-07 DIAGNOSIS — Z9181 History of falling: Secondary | ICD-10-CM | POA: Diagnosis not present

## 2021-06-07 DIAGNOSIS — J449 Chronic obstructive pulmonary disease, unspecified: Secondary | ICD-10-CM | POA: Diagnosis not present

## 2021-06-07 DIAGNOSIS — E785 Hyperlipidemia, unspecified: Secondary | ICD-10-CM | POA: Diagnosis not present

## 2021-06-07 DIAGNOSIS — K589 Irritable bowel syndrome without diarrhea: Secondary | ICD-10-CM | POA: Diagnosis not present

## 2021-06-07 DIAGNOSIS — E871 Hypo-osmolality and hyponatremia: Secondary | ICD-10-CM | POA: Diagnosis not present

## 2021-06-07 DIAGNOSIS — I10 Essential (primary) hypertension: Secondary | ICD-10-CM | POA: Diagnosis not present

## 2021-06-07 DIAGNOSIS — R2689 Other abnormalities of gait and mobility: Secondary | ICD-10-CM | POA: Diagnosis not present

## 2021-06-09 DIAGNOSIS — J449 Chronic obstructive pulmonary disease, unspecified: Secondary | ICD-10-CM | POA: Diagnosis not present

## 2021-06-09 DIAGNOSIS — I10 Essential (primary) hypertension: Secondary | ICD-10-CM | POA: Diagnosis not present

## 2021-06-09 DIAGNOSIS — E785 Hyperlipidemia, unspecified: Secondary | ICD-10-CM | POA: Diagnosis not present

## 2021-06-09 DIAGNOSIS — Z9181 History of falling: Secondary | ICD-10-CM | POA: Diagnosis not present

## 2021-06-09 DIAGNOSIS — R2689 Other abnormalities of gait and mobility: Secondary | ICD-10-CM | POA: Diagnosis not present

## 2021-06-09 DIAGNOSIS — E871 Hypo-osmolality and hyponatremia: Secondary | ICD-10-CM | POA: Diagnosis not present

## 2021-06-09 DIAGNOSIS — K589 Irritable bowel syndrome without diarrhea: Secondary | ICD-10-CM | POA: Diagnosis not present

## 2021-06-09 DIAGNOSIS — J9620 Acute and chronic respiratory failure, unspecified whether with hypoxia or hypercapnia: Secondary | ICD-10-CM | POA: Diagnosis not present

## 2021-06-10 DIAGNOSIS — Z9181 History of falling: Secondary | ICD-10-CM | POA: Diagnosis not present

## 2021-06-10 DIAGNOSIS — I10 Essential (primary) hypertension: Secondary | ICD-10-CM | POA: Diagnosis not present

## 2021-06-10 DIAGNOSIS — E871 Hypo-osmolality and hyponatremia: Secondary | ICD-10-CM | POA: Diagnosis not present

## 2021-06-10 DIAGNOSIS — J449 Chronic obstructive pulmonary disease, unspecified: Secondary | ICD-10-CM | POA: Diagnosis not present

## 2021-06-10 DIAGNOSIS — J9620 Acute and chronic respiratory failure, unspecified whether with hypoxia or hypercapnia: Secondary | ICD-10-CM | POA: Diagnosis not present

## 2021-06-10 DIAGNOSIS — K589 Irritable bowel syndrome without diarrhea: Secondary | ICD-10-CM | POA: Diagnosis not present

## 2021-06-10 DIAGNOSIS — E785 Hyperlipidemia, unspecified: Secondary | ICD-10-CM | POA: Diagnosis not present

## 2021-06-10 DIAGNOSIS — R2689 Other abnormalities of gait and mobility: Secondary | ICD-10-CM | POA: Diagnosis not present

## 2021-06-16 DIAGNOSIS — J441 Chronic obstructive pulmonary disease with (acute) exacerbation: Secondary | ICD-10-CM | POA: Diagnosis not present

## 2021-06-16 DIAGNOSIS — I272 Pulmonary hypertension, unspecified: Secondary | ICD-10-CM | POA: Diagnosis not present

## 2021-06-18 DIAGNOSIS — Z9981 Dependence on supplemental oxygen: Secondary | ICD-10-CM | POA: Diagnosis not present

## 2021-06-18 DIAGNOSIS — J449 Chronic obstructive pulmonary disease, unspecified: Secondary | ICD-10-CM | POA: Diagnosis not present

## 2021-06-18 DIAGNOSIS — C679 Malignant neoplasm of bladder, unspecified: Secondary | ICD-10-CM | POA: Diagnosis not present

## 2021-06-18 DIAGNOSIS — R059 Cough, unspecified: Secondary | ICD-10-CM | POA: Diagnosis not present

## 2021-06-18 DIAGNOSIS — Z743 Need for continuous supervision: Secondary | ICD-10-CM | POA: Diagnosis not present

## 2021-06-18 DIAGNOSIS — R609 Edema, unspecified: Secondary | ICD-10-CM | POA: Diagnosis not present

## 2021-06-18 DIAGNOSIS — M7989 Other specified soft tissue disorders: Secondary | ICD-10-CM | POA: Diagnosis not present

## 2021-06-18 DIAGNOSIS — R69 Illness, unspecified: Secondary | ICD-10-CM | POA: Diagnosis not present

## 2021-06-18 DIAGNOSIS — I10 Essential (primary) hypertension: Secondary | ICD-10-CM | POA: Diagnosis not present

## 2021-06-18 DIAGNOSIS — R0902 Hypoxemia: Secondary | ICD-10-CM | POA: Diagnosis not present

## 2021-06-18 DIAGNOSIS — Z79899 Other long term (current) drug therapy: Secondary | ICD-10-CM | POA: Diagnosis not present

## 2021-06-18 DIAGNOSIS — W19XXXA Unspecified fall, initial encounter: Secondary | ICD-10-CM | POA: Diagnosis not present

## 2021-06-18 DIAGNOSIS — M549 Dorsalgia, unspecified: Secondary | ICD-10-CM | POA: Diagnosis not present

## 2021-06-22 DIAGNOSIS — N3289 Other specified disorders of bladder: Secondary | ICD-10-CM | POA: Diagnosis not present

## 2021-06-22 DIAGNOSIS — R531 Weakness: Secondary | ICD-10-CM | POA: Diagnosis not present

## 2021-06-22 DIAGNOSIS — I5030 Unspecified diastolic (congestive) heart failure: Secondary | ICD-10-CM | POA: Diagnosis not present

## 2021-06-22 DIAGNOSIS — I2510 Atherosclerotic heart disease of native coronary artery without angina pectoris: Secondary | ICD-10-CM | POA: Diagnosis not present

## 2021-06-22 DIAGNOSIS — R69 Illness, unspecified: Secondary | ICD-10-CM | POA: Diagnosis not present

## 2021-06-22 DIAGNOSIS — J9611 Chronic respiratory failure with hypoxia: Secondary | ICD-10-CM | POA: Diagnosis not present

## 2021-06-22 DIAGNOSIS — I272 Pulmonary hypertension, unspecified: Secondary | ICD-10-CM | POA: Diagnosis not present

## 2021-06-22 DIAGNOSIS — J449 Chronic obstructive pulmonary disease, unspecified: Secondary | ICD-10-CM | POA: Diagnosis not present

## 2021-06-27 DIAGNOSIS — R110 Nausea: Secondary | ICD-10-CM | POA: Diagnosis not present

## 2021-06-27 DIAGNOSIS — I959 Hypotension, unspecified: Secondary | ICD-10-CM | POA: Diagnosis not present

## 2021-06-27 DIAGNOSIS — J9811 Atelectasis: Secondary | ICD-10-CM | POA: Diagnosis not present

## 2021-06-27 DIAGNOSIS — A419 Sepsis, unspecified organism: Secondary | ICD-10-CM | POA: Diagnosis not present

## 2021-06-27 DIAGNOSIS — R569 Unspecified convulsions: Secondary | ICD-10-CM | POA: Diagnosis not present

## 2021-06-27 DIAGNOSIS — Z743 Need for continuous supervision: Secondary | ICD-10-CM | POA: Diagnosis not present

## 2021-06-27 DIAGNOSIS — N3001 Acute cystitis with hematuria: Secondary | ICD-10-CM | POA: Diagnosis not present

## 2021-06-27 DIAGNOSIS — R031 Nonspecific low blood-pressure reading: Secondary | ICD-10-CM | POA: Diagnosis not present

## 2021-06-27 DIAGNOSIS — R55 Syncope and collapse: Secondary | ICD-10-CM | POA: Diagnosis not present

## 2021-06-28 DIAGNOSIS — Z4682 Encounter for fitting and adjustment of non-vascular catheter: Secondary | ICD-10-CM | POA: Diagnosis not present

## 2021-06-28 DIAGNOSIS — D62 Acute posthemorrhagic anemia: Secondary | ICD-10-CM | POA: Diagnosis not present

## 2021-06-28 DIAGNOSIS — I700 Atherosclerosis of aorta: Secondary | ICD-10-CM | POA: Diagnosis not present

## 2021-06-28 DIAGNOSIS — J9811 Atelectasis: Secondary | ICD-10-CM | POA: Diagnosis not present

## 2021-06-28 DIAGNOSIS — A419 Sepsis, unspecified organism: Secondary | ICD-10-CM | POA: Diagnosis not present

## 2021-06-28 DIAGNOSIS — J189 Pneumonia, unspecified organism: Secondary | ICD-10-CM | POA: Diagnosis not present

## 2021-06-28 DIAGNOSIS — I82432 Acute embolism and thrombosis of left popliteal vein: Secondary | ICD-10-CM | POA: Diagnosis not present

## 2021-06-28 DIAGNOSIS — K920 Hematemesis: Secondary | ICD-10-CM | POA: Diagnosis not present

## 2021-06-28 DIAGNOSIS — E871 Hypo-osmolality and hyponatremia: Secondary | ICD-10-CM | POA: Diagnosis not present

## 2021-06-28 DIAGNOSIS — Z9911 Dependence on respirator [ventilator] status: Secondary | ICD-10-CM | POA: Diagnosis not present

## 2021-06-28 DIAGNOSIS — R69 Illness, unspecified: Secondary | ICD-10-CM | POA: Diagnosis not present

## 2021-06-28 DIAGNOSIS — I743 Embolism and thrombosis of arteries of the lower extremities: Secondary | ICD-10-CM | POA: Diagnosis not present

## 2021-06-28 DIAGNOSIS — J9620 Acute and chronic respiratory failure, unspecified whether with hypoxia or hypercapnia: Secondary | ICD-10-CM | POA: Diagnosis not present

## 2021-06-28 DIAGNOSIS — K922 Gastrointestinal hemorrhage, unspecified: Secondary | ICD-10-CM | POA: Diagnosis not present

## 2021-06-28 DIAGNOSIS — K270 Acute peptic ulcer, site unspecified, with hemorrhage: Secondary | ICD-10-CM | POA: Diagnosis not present

## 2021-06-28 DIAGNOSIS — J449 Chronic obstructive pulmonary disease, unspecified: Secondary | ICD-10-CM | POA: Diagnosis not present

## 2021-06-28 DIAGNOSIS — J982 Interstitial emphysema: Secondary | ICD-10-CM | POA: Diagnosis not present

## 2021-06-28 DIAGNOSIS — R000 Tachycardia, unspecified: Secondary | ICD-10-CM | POA: Diagnosis not present

## 2021-06-28 DIAGNOSIS — R0600 Dyspnea, unspecified: Secondary | ICD-10-CM | POA: Diagnosis not present

## 2021-06-28 DIAGNOSIS — R571 Hypovolemic shock: Secondary | ICD-10-CM | POA: Diagnosis not present

## 2021-06-28 DIAGNOSIS — I872 Venous insufficiency (chronic) (peripheral): Secondary | ICD-10-CM | POA: Diagnosis not present

## 2021-06-28 DIAGNOSIS — R031 Nonspecific low blood-pressure reading: Secondary | ICD-10-CM | POA: Diagnosis not present

## 2021-06-28 DIAGNOSIS — J9690 Respiratory failure, unspecified, unspecified whether with hypoxia or hypercapnia: Secondary | ICD-10-CM | POA: Diagnosis not present

## 2021-06-28 DIAGNOSIS — E872 Acidosis, unspecified: Secondary | ICD-10-CM | POA: Diagnosis not present

## 2021-06-28 DIAGNOSIS — R569 Unspecified convulsions: Secondary | ICD-10-CM | POA: Diagnosis not present

## 2021-06-28 DIAGNOSIS — R0602 Shortness of breath: Secondary | ICD-10-CM | POA: Diagnosis not present

## 2021-06-28 DIAGNOSIS — R6521 Severe sepsis with septic shock: Secondary | ICD-10-CM | POA: Diagnosis not present

## 2021-06-28 DIAGNOSIS — Z4659 Encounter for fitting and adjustment of other gastrointestinal appliance and device: Secondary | ICD-10-CM | POA: Diagnosis not present

## 2021-06-28 DIAGNOSIS — N170 Acute kidney failure with tubular necrosis: Secondary | ICD-10-CM | POA: Diagnosis not present

## 2021-06-28 DIAGNOSIS — I959 Hypotension, unspecified: Secondary | ICD-10-CM | POA: Diagnosis not present

## 2021-06-28 DIAGNOSIS — I70213 Atherosclerosis of native arteries of extremities with intermittent claudication, bilateral legs: Secondary | ICD-10-CM | POA: Diagnosis not present

## 2021-06-28 DIAGNOSIS — K921 Melena: Secondary | ICD-10-CM | POA: Diagnosis not present

## 2021-06-28 DIAGNOSIS — N3001 Acute cystitis with hematuria: Secondary | ICD-10-CM | POA: Diagnosis not present

## 2021-06-28 DIAGNOSIS — K269 Duodenal ulcer, unspecified as acute or chronic, without hemorrhage or perforation: Secondary | ICD-10-CM | POA: Diagnosis not present

## 2021-06-28 DIAGNOSIS — D649 Anemia, unspecified: Secondary | ICD-10-CM | POA: Diagnosis not present

## 2021-06-28 DIAGNOSIS — Z87828 Personal history of other (healed) physical injury and trauma: Secondary | ICD-10-CM | POA: Diagnosis not present

## 2021-06-28 DIAGNOSIS — Z452 Encounter for adjustment and management of vascular access device: Secondary | ICD-10-CM | POA: Diagnosis not present

## 2021-06-28 DIAGNOSIS — J439 Emphysema, unspecified: Secondary | ICD-10-CM | POA: Diagnosis not present

## 2021-06-28 DIAGNOSIS — K264 Chronic or unspecified duodenal ulcer with hemorrhage: Secondary | ICD-10-CM | POA: Diagnosis not present

## 2021-06-28 DIAGNOSIS — I517 Cardiomegaly: Secondary | ICD-10-CM | POA: Diagnosis not present

## 2021-06-29 DIAGNOSIS — K922 Gastrointestinal hemorrhage, unspecified: Secondary | ICD-10-CM | POA: Diagnosis not present

## 2021-06-29 DIAGNOSIS — K920 Hematemesis: Secondary | ICD-10-CM | POA: Diagnosis not present

## 2021-06-29 DIAGNOSIS — J449 Chronic obstructive pulmonary disease, unspecified: Secondary | ICD-10-CM | POA: Diagnosis not present

## 2021-06-29 DIAGNOSIS — R571 Hypovolemic shock: Secondary | ICD-10-CM | POA: Diagnosis not present

## 2021-06-29 DIAGNOSIS — K264 Chronic or unspecified duodenal ulcer with hemorrhage: Secondary | ICD-10-CM | POA: Diagnosis not present

## 2021-06-29 DIAGNOSIS — D649 Anemia, unspecified: Secondary | ICD-10-CM | POA: Diagnosis not present

## 2021-06-29 DIAGNOSIS — I959 Hypotension, unspecified: Secondary | ICD-10-CM | POA: Diagnosis not present

## 2021-06-29 DIAGNOSIS — I872 Venous insufficiency (chronic) (peripheral): Secondary | ICD-10-CM | POA: Diagnosis not present

## 2021-06-29 DIAGNOSIS — Z9911 Dependence on respirator [ventilator] status: Secondary | ICD-10-CM | POA: Diagnosis not present

## 2021-06-29 DIAGNOSIS — Z4682 Encounter for fitting and adjustment of non-vascular catheter: Secondary | ICD-10-CM | POA: Diagnosis not present

## 2021-06-29 DIAGNOSIS — J9620 Acute and chronic respiratory failure, unspecified whether with hypoxia or hypercapnia: Secondary | ICD-10-CM | POA: Diagnosis not present

## 2021-06-29 DIAGNOSIS — K921 Melena: Secondary | ICD-10-CM | POA: Diagnosis not present

## 2021-06-29 DIAGNOSIS — I700 Atherosclerosis of aorta: Secondary | ICD-10-CM | POA: Diagnosis not present

## 2021-06-29 DIAGNOSIS — K269 Duodenal ulcer, unspecified as acute or chronic, without hemorrhage or perforation: Secondary | ICD-10-CM | POA: Diagnosis not present

## 2021-06-29 DIAGNOSIS — A419 Sepsis, unspecified organism: Secondary | ICD-10-CM | POA: Diagnosis not present

## 2021-06-29 DIAGNOSIS — R69 Illness, unspecified: Secondary | ICD-10-CM | POA: Diagnosis not present

## 2021-06-30 DIAGNOSIS — K922 Gastrointestinal hemorrhage, unspecified: Secondary | ICD-10-CM | POA: Diagnosis not present

## 2021-06-30 DIAGNOSIS — K270 Acute peptic ulcer, site unspecified, with hemorrhage: Secondary | ICD-10-CM | POA: Diagnosis not present

## 2021-06-30 DIAGNOSIS — I872 Venous insufficiency (chronic) (peripheral): Secondary | ICD-10-CM | POA: Diagnosis not present

## 2021-06-30 DIAGNOSIS — K921 Melena: Secondary | ICD-10-CM | POA: Diagnosis not present

## 2021-06-30 DIAGNOSIS — Z87828 Personal history of other (healed) physical injury and trauma: Secondary | ICD-10-CM | POA: Diagnosis not present

## 2021-06-30 DIAGNOSIS — K269 Duodenal ulcer, unspecified as acute or chronic, without hemorrhage or perforation: Secondary | ICD-10-CM | POA: Diagnosis not present

## 2021-06-30 DIAGNOSIS — R571 Hypovolemic shock: Secondary | ICD-10-CM | POA: Diagnosis not present

## 2021-06-30 DIAGNOSIS — K264 Chronic or unspecified duodenal ulcer with hemorrhage: Secondary | ICD-10-CM | POA: Diagnosis not present

## 2021-06-30 DIAGNOSIS — J982 Interstitial emphysema: Secondary | ICD-10-CM | POA: Diagnosis not present

## 2021-06-30 DIAGNOSIS — K920 Hematemesis: Secondary | ICD-10-CM | POA: Diagnosis not present

## 2021-06-30 DIAGNOSIS — J9690 Respiratory failure, unspecified, unspecified whether with hypoxia or hypercapnia: Secondary | ICD-10-CM | POA: Diagnosis not present

## 2021-06-30 DIAGNOSIS — A419 Sepsis, unspecified organism: Secondary | ICD-10-CM | POA: Diagnosis not present

## 2021-06-30 DIAGNOSIS — R69 Illness, unspecified: Secondary | ICD-10-CM | POA: Diagnosis not present

## 2021-06-30 DIAGNOSIS — D649 Anemia, unspecified: Secondary | ICD-10-CM | POA: Diagnosis not present

## 2021-06-30 DIAGNOSIS — Z4682 Encounter for fitting and adjustment of non-vascular catheter: Secondary | ICD-10-CM | POA: Diagnosis not present

## 2021-06-30 DIAGNOSIS — I517 Cardiomegaly: Secondary | ICD-10-CM | POA: Diagnosis not present

## 2021-06-30 DIAGNOSIS — J449 Chronic obstructive pulmonary disease, unspecified: Secondary | ICD-10-CM | POA: Diagnosis not present

## 2021-06-30 DIAGNOSIS — J439 Emphysema, unspecified: Secondary | ICD-10-CM | POA: Diagnosis not present

## 2021-06-30 DIAGNOSIS — J9620 Acute and chronic respiratory failure, unspecified whether with hypoxia or hypercapnia: Secondary | ICD-10-CM | POA: Diagnosis not present

## 2021-06-30 DIAGNOSIS — Z9911 Dependence on respirator [ventilator] status: Secondary | ICD-10-CM | POA: Diagnosis not present

## 2021-07-01 DIAGNOSIS — J449 Chronic obstructive pulmonary disease, unspecified: Secondary | ICD-10-CM | POA: Diagnosis not present

## 2021-07-01 DIAGNOSIS — R69 Illness, unspecified: Secondary | ICD-10-CM | POA: Diagnosis not present

## 2021-07-01 DIAGNOSIS — Z9911 Dependence on respirator [ventilator] status: Secondary | ICD-10-CM | POA: Diagnosis not present

## 2021-07-01 DIAGNOSIS — J9620 Acute and chronic respiratory failure, unspecified whether with hypoxia or hypercapnia: Secondary | ICD-10-CM | POA: Diagnosis not present

## 2021-07-01 DIAGNOSIS — K921 Melena: Secondary | ICD-10-CM | POA: Diagnosis not present

## 2021-07-01 DIAGNOSIS — K920 Hematemesis: Secondary | ICD-10-CM | POA: Diagnosis not present

## 2021-07-01 DIAGNOSIS — I743 Embolism and thrombosis of arteries of the lower extremities: Secondary | ICD-10-CM | POA: Diagnosis not present

## 2021-07-01 DIAGNOSIS — I82432 Acute embolism and thrombosis of left popliteal vein: Secondary | ICD-10-CM | POA: Diagnosis not present

## 2021-07-01 DIAGNOSIS — K269 Duodenal ulcer, unspecified as acute or chronic, without hemorrhage or perforation: Secondary | ICD-10-CM | POA: Diagnosis not present

## 2021-07-01 DIAGNOSIS — R571 Hypovolemic shock: Secondary | ICD-10-CM | POA: Diagnosis not present

## 2021-07-01 DIAGNOSIS — J9690 Respiratory failure, unspecified, unspecified whether with hypoxia or hypercapnia: Secondary | ICD-10-CM | POA: Diagnosis not present

## 2021-07-01 DIAGNOSIS — D649 Anemia, unspecified: Secondary | ICD-10-CM | POA: Diagnosis not present

## 2021-07-01 DIAGNOSIS — A419 Sepsis, unspecified organism: Secondary | ICD-10-CM | POA: Diagnosis not present

## 2021-07-01 DIAGNOSIS — K264 Chronic or unspecified duodenal ulcer with hemorrhage: Secondary | ICD-10-CM | POA: Diagnosis not present

## 2021-07-01 DIAGNOSIS — I70213 Atherosclerosis of native arteries of extremities with intermittent claudication, bilateral legs: Secondary | ICD-10-CM | POA: Diagnosis not present

## 2021-07-02 DIAGNOSIS — Z9981 Dependence on supplemental oxygen: Secondary | ICD-10-CM | POA: Diagnosis not present

## 2021-07-02 DIAGNOSIS — Z720 Tobacco use: Secondary | ICD-10-CM | POA: Diagnosis not present

## 2021-07-02 DIAGNOSIS — I998 Other disorder of circulatory system: Secondary | ICD-10-CM | POA: Diagnosis not present

## 2021-07-02 DIAGNOSIS — Z4682 Encounter for fitting and adjustment of non-vascular catheter: Secondary | ICD-10-CM | POA: Diagnosis not present

## 2021-07-02 DIAGNOSIS — R69 Illness, unspecified: Secondary | ICD-10-CM | POA: Diagnosis not present

## 2021-07-02 DIAGNOSIS — R6521 Severe sepsis with septic shock: Secondary | ICD-10-CM | POA: Diagnosis not present

## 2021-07-02 DIAGNOSIS — A419 Sepsis, unspecified organism: Secondary | ICD-10-CM | POA: Diagnosis not present

## 2021-07-02 DIAGNOSIS — D72829 Elevated white blood cell count, unspecified: Secondary | ICD-10-CM | POA: Diagnosis not present

## 2021-07-02 DIAGNOSIS — R571 Hypovolemic shock: Secondary | ICD-10-CM | POA: Diagnosis not present

## 2021-07-02 DIAGNOSIS — K269 Duodenal ulcer, unspecified as acute or chronic, without hemorrhage or perforation: Secondary | ICD-10-CM | POA: Diagnosis not present

## 2021-07-02 DIAGNOSIS — E871 Hypo-osmolality and hyponatremia: Secondary | ICD-10-CM | POA: Diagnosis not present

## 2021-07-02 DIAGNOSIS — I70222 Atherosclerosis of native arteries of extremities with rest pain, left leg: Secondary | ICD-10-CM | POA: Diagnosis not present

## 2021-07-02 DIAGNOSIS — I82432 Acute embolism and thrombosis of left popliteal vein: Secondary | ICD-10-CM | POA: Diagnosis not present

## 2021-07-02 DIAGNOSIS — K264 Chronic or unspecified duodenal ulcer with hemorrhage: Secondary | ICD-10-CM | POA: Diagnosis not present

## 2021-07-02 DIAGNOSIS — I130 Hypertensive heart and chronic kidney disease with heart failure and stage 1 through stage 4 chronic kidney disease, or unspecified chronic kidney disease: Secondary | ICD-10-CM | POA: Diagnosis not present

## 2021-07-02 DIAGNOSIS — D649 Anemia, unspecified: Secondary | ICD-10-CM | POA: Diagnosis not present

## 2021-07-02 DIAGNOSIS — R918 Other nonspecific abnormal finding of lung field: Secondary | ICD-10-CM | POA: Diagnosis not present

## 2021-07-02 DIAGNOSIS — I272 Pulmonary hypertension, unspecified: Secondary | ICD-10-CM | POA: Diagnosis not present

## 2021-07-02 DIAGNOSIS — K921 Melena: Secondary | ICD-10-CM | POA: Diagnosis not present

## 2021-07-02 DIAGNOSIS — K920 Hematemesis: Secondary | ICD-10-CM | POA: Diagnosis not present

## 2021-07-02 DIAGNOSIS — J90 Pleural effusion, not elsewhere classified: Secondary | ICD-10-CM | POA: Diagnosis not present

## 2021-07-02 DIAGNOSIS — J449 Chronic obstructive pulmonary disease, unspecified: Secondary | ICD-10-CM | POA: Diagnosis not present

## 2021-07-02 DIAGNOSIS — E8809 Other disorders of plasma-protein metabolism, not elsewhere classified: Secondary | ICD-10-CM | POA: Diagnosis not present

## 2021-07-02 DIAGNOSIS — N183 Chronic kidney disease, stage 3 unspecified: Secondary | ICD-10-CM | POA: Diagnosis not present

## 2021-07-02 DIAGNOSIS — I50812 Chronic right heart failure: Secondary | ICD-10-CM | POA: Diagnosis not present

## 2021-07-02 DIAGNOSIS — D696 Thrombocytopenia, unspecified: Secondary | ICD-10-CM | POA: Diagnosis not present

## 2021-07-02 DIAGNOSIS — R739 Hyperglycemia, unspecified: Secondary | ICD-10-CM | POA: Diagnosis not present

## 2021-07-02 DIAGNOSIS — B9689 Other specified bacterial agents as the cause of diseases classified elsewhere: Secondary | ICD-10-CM | POA: Diagnosis not present

## 2021-07-02 DIAGNOSIS — Z66 Do not resuscitate: Secondary | ICD-10-CM | POA: Diagnosis not present

## 2021-07-02 DIAGNOSIS — J9811 Atelectasis: Secondary | ICD-10-CM | POA: Diagnosis not present

## 2021-07-02 DIAGNOSIS — D62 Acute posthemorrhagic anemia: Secondary | ICD-10-CM | POA: Diagnosis not present

## 2021-07-02 DIAGNOSIS — J9621 Acute and chronic respiratory failure with hypoxia: Secondary | ICD-10-CM | POA: Diagnosis not present

## 2021-07-02 DIAGNOSIS — K922 Gastrointestinal hemorrhage, unspecified: Secondary | ICD-10-CM | POA: Diagnosis not present

## 2021-07-02 DIAGNOSIS — I743 Embolism and thrombosis of arteries of the lower extremities: Secondary | ICD-10-CM | POA: Diagnosis not present

## 2021-07-02 DIAGNOSIS — J159 Unspecified bacterial pneumonia: Secondary | ICD-10-CM | POA: Diagnosis not present

## 2021-07-02 DIAGNOSIS — N179 Acute kidney failure, unspecified: Secondary | ICD-10-CM | POA: Diagnosis not present

## 2021-07-02 DIAGNOSIS — E878 Other disorders of electrolyte and fluid balance, not elsewhere classified: Secondary | ICD-10-CM | POA: Diagnosis not present

## 2021-07-03 DIAGNOSIS — A419 Sepsis, unspecified organism: Secondary | ICD-10-CM | POA: Diagnosis not present

## 2021-07-03 DIAGNOSIS — K264 Chronic or unspecified duodenal ulcer with hemorrhage: Secondary | ICD-10-CM | POA: Diagnosis not present

## 2021-07-03 DIAGNOSIS — I10 Essential (primary) hypertension: Secondary | ICD-10-CM | POA: Diagnosis not present

## 2021-07-03 DIAGNOSIS — I743 Embolism and thrombosis of arteries of the lower extremities: Secondary | ICD-10-CM | POA: Diagnosis not present

## 2021-07-03 DIAGNOSIS — I998 Other disorder of circulatory system: Secondary | ICD-10-CM | POA: Diagnosis not present

## 2021-07-03 DIAGNOSIS — Z515 Encounter for palliative care: Secondary | ICD-10-CM | POA: Diagnosis not present

## 2021-07-03 DIAGNOSIS — K922 Gastrointestinal hemorrhage, unspecified: Secondary | ICD-10-CM | POA: Diagnosis not present

## 2021-07-03 DIAGNOSIS — I272 Pulmonary hypertension, unspecified: Secondary | ICD-10-CM | POA: Diagnosis not present

## 2021-07-03 DIAGNOSIS — R0600 Dyspnea, unspecified: Secondary | ICD-10-CM | POA: Diagnosis not present

## 2021-07-03 DIAGNOSIS — D649 Anemia, unspecified: Secondary | ICD-10-CM | POA: Diagnosis not present

## 2021-07-03 DIAGNOSIS — D72829 Elevated white blood cell count, unspecified: Secondary | ICD-10-CM | POA: Diagnosis not present

## 2021-07-03 DIAGNOSIS — J9621 Acute and chronic respiratory failure with hypoxia: Secondary | ICD-10-CM | POA: Diagnosis not present

## 2021-07-03 DIAGNOSIS — R571 Hypovolemic shock: Secondary | ICD-10-CM | POA: Diagnosis not present

## 2021-07-03 DIAGNOSIS — R69 Illness, unspecified: Secondary | ICD-10-CM | POA: Diagnosis not present

## 2021-07-03 DIAGNOSIS — D62 Acute posthemorrhagic anemia: Secondary | ICD-10-CM | POA: Diagnosis not present

## 2021-07-03 DIAGNOSIS — N179 Acute kidney failure, unspecified: Secondary | ICD-10-CM | POA: Diagnosis not present

## 2021-07-03 DIAGNOSIS — J9611 Chronic respiratory failure with hypoxia: Secondary | ICD-10-CM | POA: Diagnosis not present

## 2021-07-03 DIAGNOSIS — Z9981 Dependence on supplemental oxygen: Secondary | ICD-10-CM | POA: Diagnosis not present

## 2021-07-03 DIAGNOSIS — Z7189 Other specified counseling: Secondary | ICD-10-CM | POA: Diagnosis not present

## 2021-07-03 DIAGNOSIS — E878 Other disorders of electrolyte and fluid balance, not elsewhere classified: Secondary | ICD-10-CM | POA: Diagnosis not present

## 2021-07-03 DIAGNOSIS — Z9911 Dependence on respirator [ventilator] status: Secondary | ICD-10-CM | POA: Diagnosis not present

## 2021-07-03 DIAGNOSIS — I82432 Acute embolism and thrombosis of left popliteal vein: Secondary | ICD-10-CM | POA: Diagnosis not present

## 2021-07-03 DIAGNOSIS — I50812 Chronic right heart failure: Secondary | ICD-10-CM | POA: Diagnosis not present

## 2021-07-03 DIAGNOSIS — D696 Thrombocytopenia, unspecified: Secondary | ICD-10-CM | POA: Diagnosis not present

## 2021-07-03 DIAGNOSIS — R6521 Severe sepsis with septic shock: Secondary | ICD-10-CM | POA: Diagnosis not present

## 2021-07-03 DIAGNOSIS — K259 Gastric ulcer, unspecified as acute or chronic, without hemorrhage or perforation: Secondary | ICD-10-CM | POA: Diagnosis not present

## 2021-07-03 DIAGNOSIS — A498 Other bacterial infections of unspecified site: Secondary | ICD-10-CM | POA: Diagnosis not present

## 2021-07-04 DIAGNOSIS — A419 Sepsis, unspecified organism: Secondary | ICD-10-CM | POA: Diagnosis not present

## 2021-07-04 DIAGNOSIS — K264 Chronic or unspecified duodenal ulcer with hemorrhage: Secondary | ICD-10-CM | POA: Diagnosis not present

## 2021-07-04 DIAGNOSIS — R571 Hypovolemic shock: Secondary | ICD-10-CM | POA: Diagnosis not present

## 2021-07-05 DIAGNOSIS — R571 Hypovolemic shock: Secondary | ICD-10-CM | POA: Diagnosis not present

## 2021-07-05 DIAGNOSIS — K264 Chronic or unspecified duodenal ulcer with hemorrhage: Secondary | ICD-10-CM | POA: Diagnosis not present

## 2021-07-05 DIAGNOSIS — A419 Sepsis, unspecified organism: Secondary | ICD-10-CM | POA: Diagnosis not present

## 2021-07-29 DEATH — deceased

## 2023-04-30 IMAGING — DX DG CHEST 1V PORT
1 series · 1 of 1 positions shown · non-contrast
Comparison: August 01, 2020.

CLINICAL DATA: Dyspnea.

EXAM:
PORTABLE CHEST 1 VIEW

[chest]
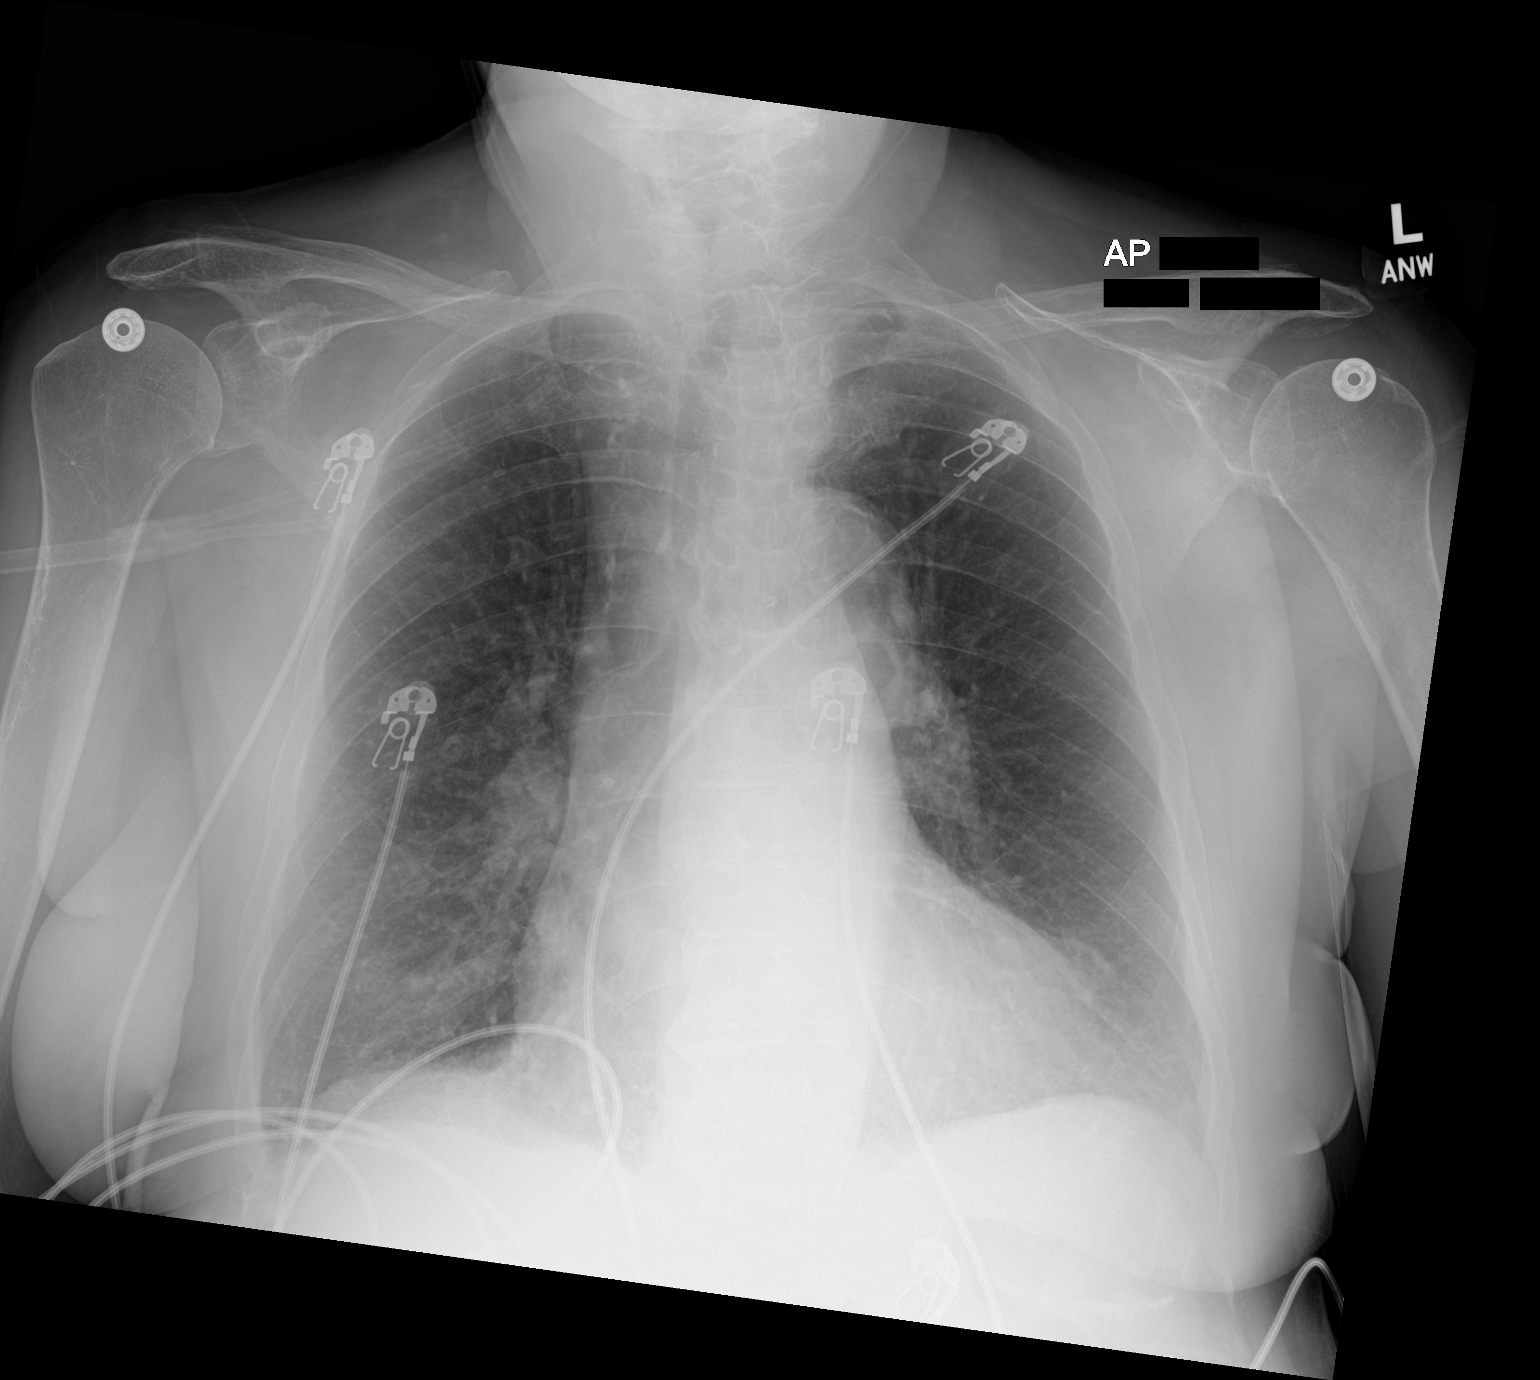

[1 of 1 positions shown; findings below may reference images not displayed]

FINDINGS: The heart size and mediastinal contours are within normal limits.
Both lungs are clear. The visualized skeletal structures are
unremarkable.
IMPRESSION: No active disease.

## 2023-06-04 IMAGING — CT CT ANGIO CHEST
2 of 6 series · 18 of 36 positions shown · IV contrast (omnipaque)
Comparison: 08/01/2020

CLINICAL DATA: PE suspected.  High probability.

EXAM:
CT ANGIOGRAPHY CHEST WITH CONTRAST
TECHNIQUE: Multidetector CT imaging of the chest was performed using the
standard protocol during bolus administration of intravenous
contrast. Multiplanar CT image reconstructions and MIPs were
obtained to evaluate the vascular anatomy.
CONTRAST:  75mL OMNIPAQUE IOHEXOL 350 MG/ML SOLN

[Series 7: pe thins · axial · 0.79mm/px · z∈[-301,-56]mm · 17 of 390 slices shown]
[im 20/390  lung]
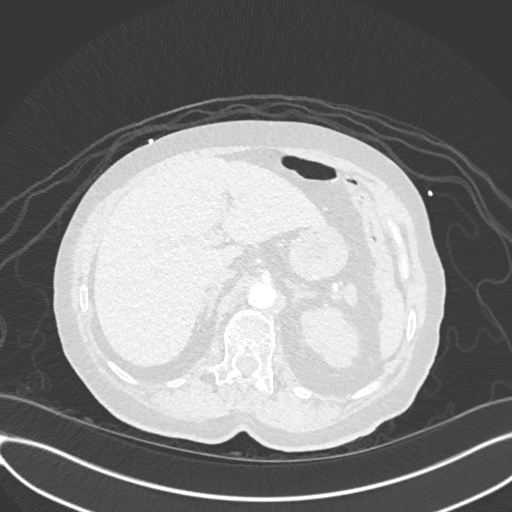
[im 39/390  mediastinal]
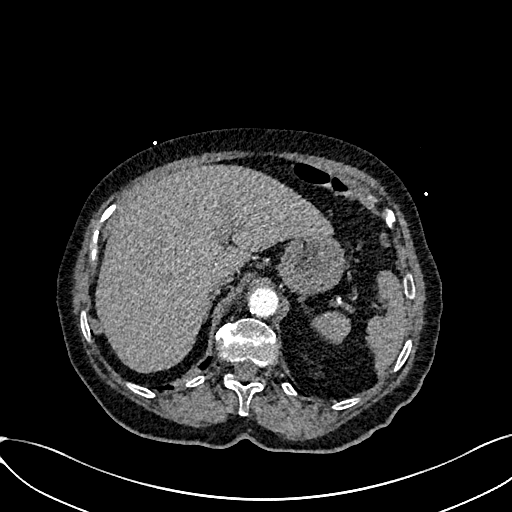
[im 59/390  lung]
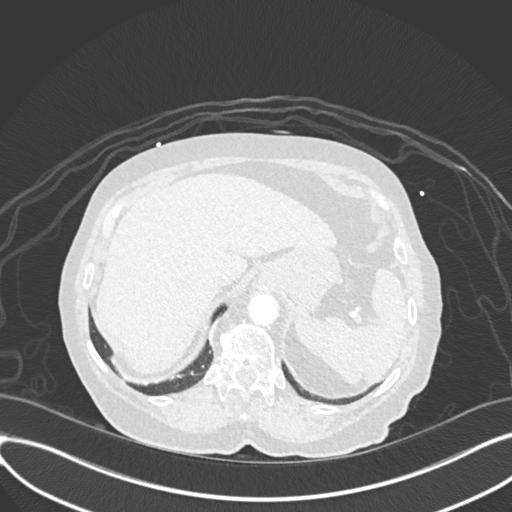
[im 78/390  mediastinal]
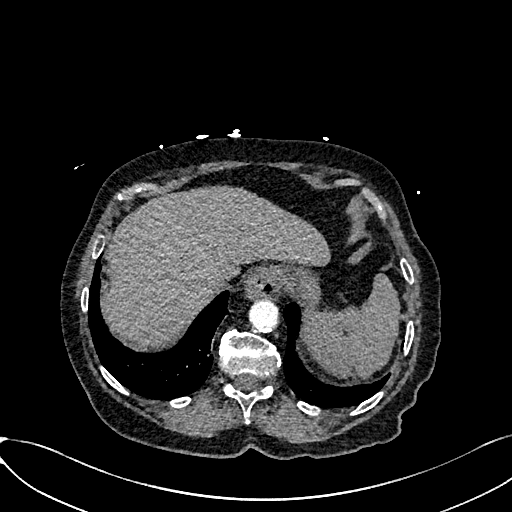
[im 117/390  lung]
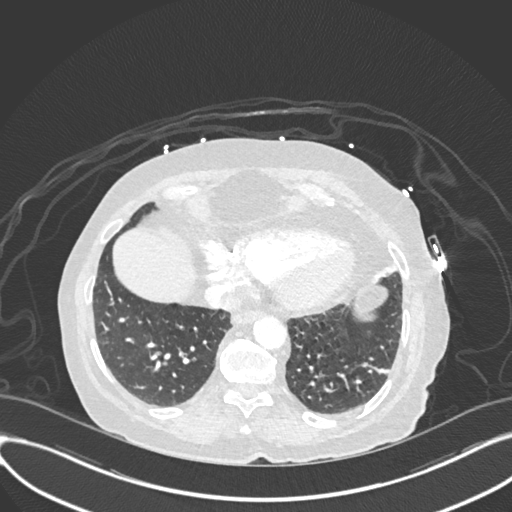
[im 137/390  mediastinal]
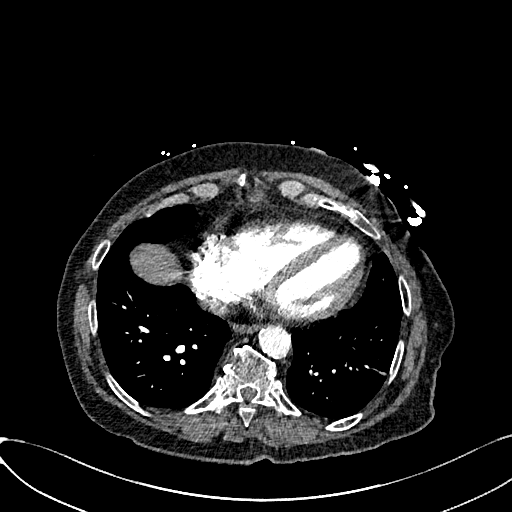
[im 156/390  lung]
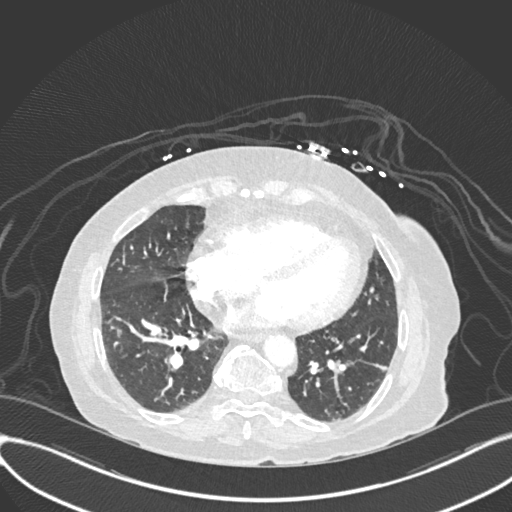
[im 176/390  mediastinal]
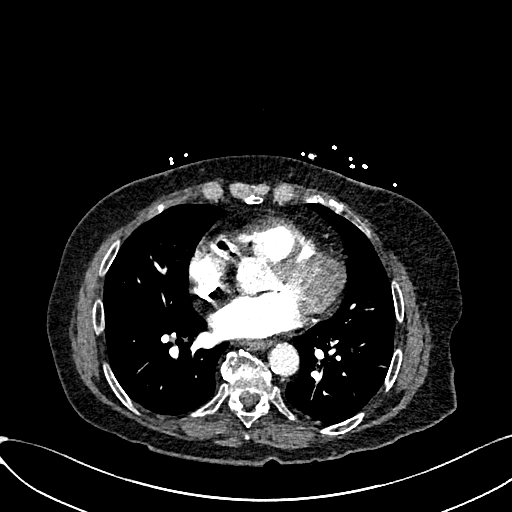
[im 195/390  lung]
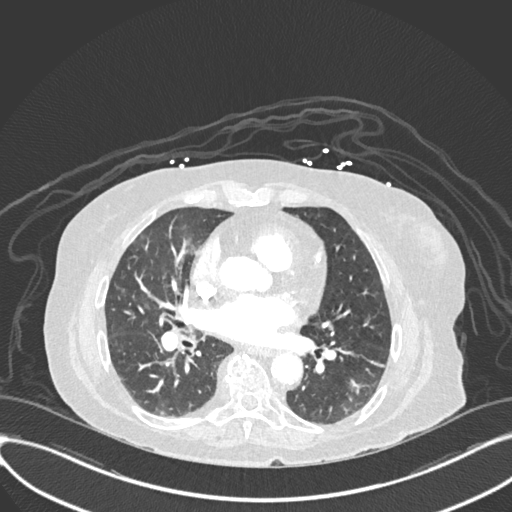
[im 214/390  mediastinal]
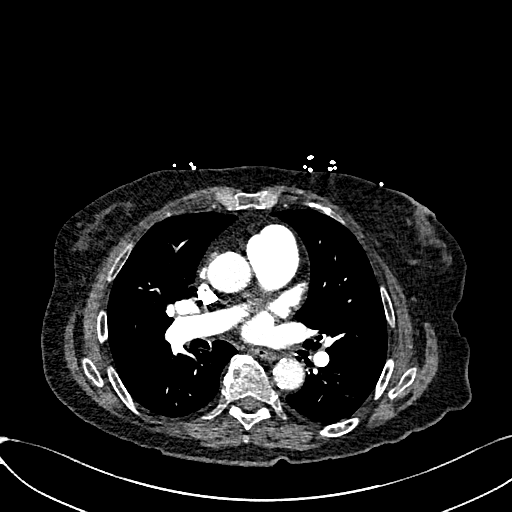
[im 234/390  lung]
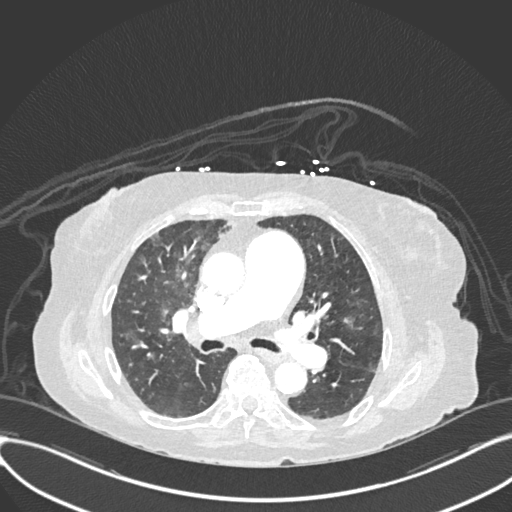
[im 253/390  mediastinal]
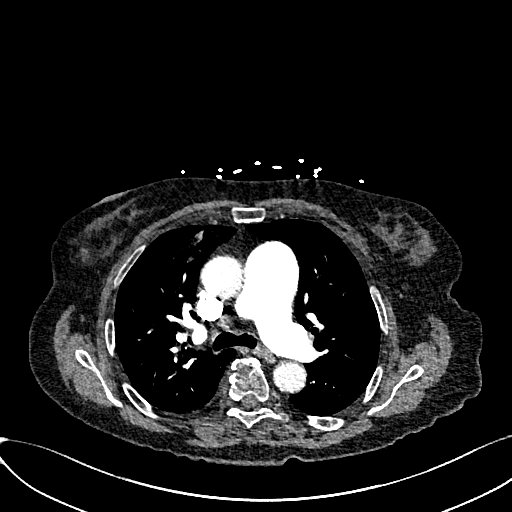
[im 273/390  lung]
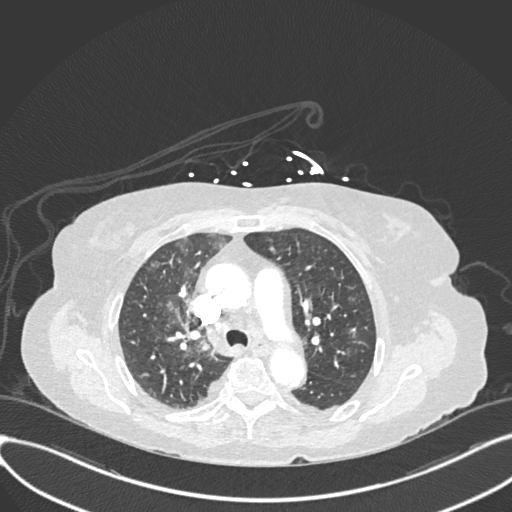
[im 312/390  mediastinal]
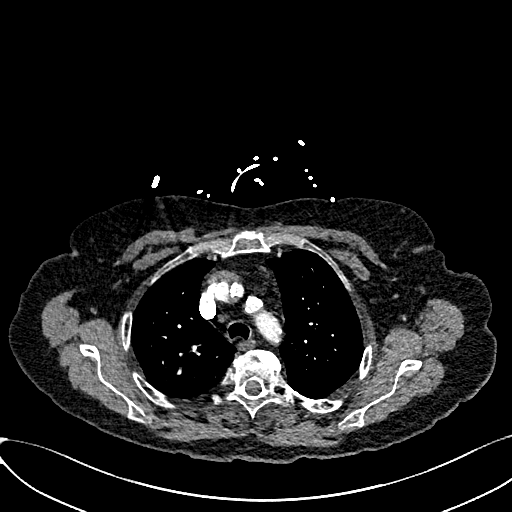
[im 331/390  lung]
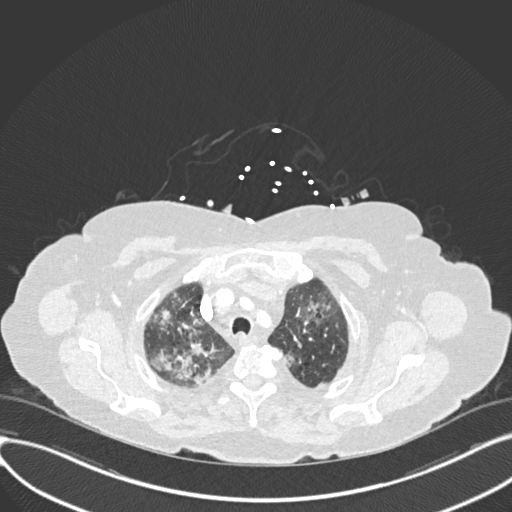
[im 351/390  mediastinal]
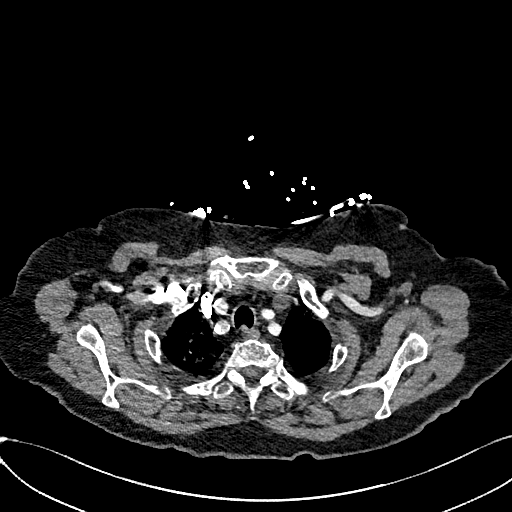
[im 370/390  lung]
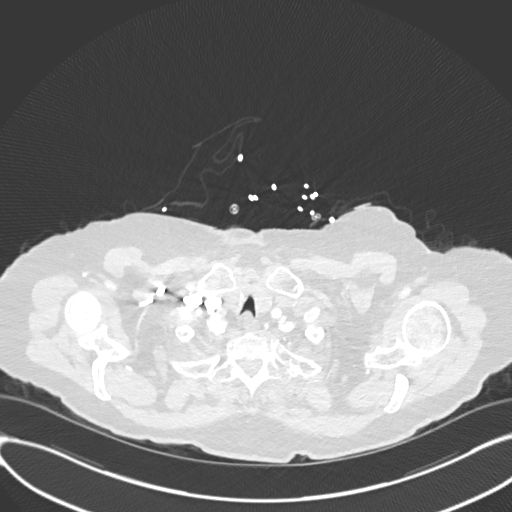

[Series 8: pe 2mm cor · coronal · 0.59mm/px · 1 of 151 slices shown]
[im 76/151  mediastinal]
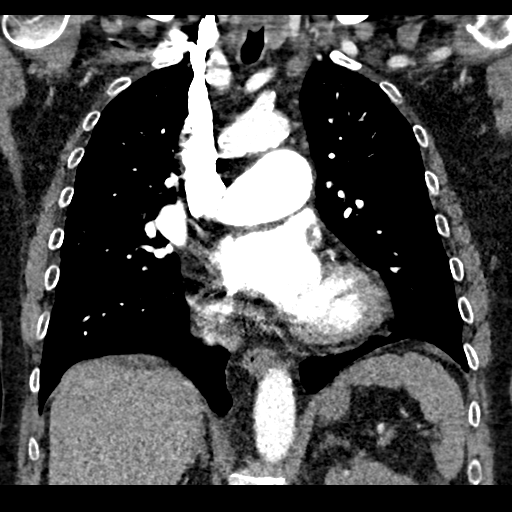

[18 of 36 positions shown; findings below may reference images not displayed]

FINDINGS: Cardiovascular: Satisfactory opacification of the pulmonary arteries
to the segmental level. No suspicious filling defects identified to
suggest acute pulmonary embolus. Increased caliber of the main
pulmonary artery measures 4.2 cm in diameter compatible with PA
hypertension.

Heart size normal. Aortic atherosclerosis and coronary artery
calcifications. No pericardial effusion.

Mediastinum/Nodes: No enlarged mediastinal, hilar, or axillary lymph
nodes. Thyroid gland, trachea, and esophagus demonstrate no
significant findings.

Lungs/Pleura: No pleural effusion. Scar noted within the left lower
lobe. Since the previous exam there has been interval development of
multifocal bilateral ground-glass opacities, most severe within the
lung apices where patchy areas of airspace densities are also noted.
These findings are new when compared with the previous exam and are
concerning for multifocal infection.

Upper Abdomen: No acute finding.  Aortic atherosclerosis.

Musculoskeletal: No acute or suspicious osseous findings. Thoracic
degenerative disc disease identified.

Review of the MIP images confirms the above findings.
IMPRESSION: 1. No evidence for acute pulmonary embolus.
2. Interval development of multifocal bilateral ground-glass
opacities, most severe within the lung apices. Findings are
concerning for multifocal infection. Atypical viral infection not
excluded.
3. Increased caliber of the main pulmonary artery compatible with PA
hypertension.
4. Coronary artery calcifications noted.
5. Aortic atherosclerosis.

Aortic Atherosclerosis ([id number]).
